# Patient Record
Sex: Male | Born: 1960 | Race: Black or African American | Hispanic: No | Marital: Married | Smoking: Current every day smoker
Health system: Southern US, Community
[De-identification: ages and names within clinical notes are randomized; demographics above are authoritative.]

## PROBLEM LIST (undated history)

## (undated) DIAGNOSIS — R002 Palpitations: Secondary | ICD-10-CM

## (undated) DIAGNOSIS — D649 Anemia, unspecified: Secondary | ICD-10-CM

## (undated) DIAGNOSIS — C61 Malignant neoplasm of prostate: Secondary | ICD-10-CM

## (undated) DIAGNOSIS — G4730 Sleep apnea, unspecified: Secondary | ICD-10-CM

## (undated) DIAGNOSIS — K219 Gastro-esophageal reflux disease without esophagitis: Secondary | ICD-10-CM

## (undated) DIAGNOSIS — I10 Essential (primary) hypertension: Secondary | ICD-10-CM

## (undated) HISTORY — DX: Malignant neoplasm of prostate: C61

## (undated) HISTORY — DX: Anemia, unspecified: D64.9

---

## 2005-02-05 ENCOUNTER — Emergency Department (HOSPITAL_COMMUNITY): Admission: EM | Admit: 2005-02-05 | Discharge: 2005-02-05 | Payer: Self-pay | Admitting: Emergency Medicine

## 2005-03-11 ENCOUNTER — Emergency Department (HOSPITAL_COMMUNITY): Admission: EM | Admit: 2005-03-11 | Discharge: 2005-03-11 | Payer: Self-pay | Admitting: Emergency Medicine

## 2006-07-20 DIAGNOSIS — R002 Palpitations: Secondary | ICD-10-CM

## 2006-07-20 HISTORY — DX: Palpitations: R00.2

## 2009-04-09 ENCOUNTER — Emergency Department (HOSPITAL_COMMUNITY): Admission: EM | Admit: 2009-04-09 | Discharge: 2009-04-09 | Payer: Self-pay | Admitting: Emergency Medicine

## 2012-01-11 ENCOUNTER — Other Ambulatory Visit: Payer: Self-pay | Admitting: Urology

## 2012-01-11 DIAGNOSIS — C61 Malignant neoplasm of prostate: Secondary | ICD-10-CM

## 2012-01-20 ENCOUNTER — Encounter (HOSPITAL_COMMUNITY)
Admission: RE | Admit: 2012-01-20 | Discharge: 2012-01-20 | Disposition: A | Discharge status: Home / Self Care | Payer: Managed Care, Other (non HMO) | Source: Ambulatory Visit | Attending: Urology | Admitting: Urology

## 2012-01-20 DIAGNOSIS — C61 Malignant neoplasm of prostate: Secondary | ICD-10-CM | POA: Insufficient documentation

## 2012-01-20 MED ORDER — TECHNETIUM TC 99M MEDRONATE IV KIT
25.0000 | PACK | Freq: Once | INTRAVENOUS | Status: AC | PRN
  Administered 2012-01-20: 25 via INTRAVENOUS

## 2012-02-22 ENCOUNTER — Other Ambulatory Visit: Payer: Self-pay | Admitting: Urology

## 2012-04-05 ENCOUNTER — Encounter (HOSPITAL_COMMUNITY): Payer: Self-pay | Admitting: Pharmacy Technician

## 2012-04-07 ENCOUNTER — Encounter (HOSPITAL_COMMUNITY): Payer: Self-pay

## 2012-04-07 ENCOUNTER — Ambulatory Visit (HOSPITAL_COMMUNITY)
Admission: RE | Admit: 2012-04-07 | Discharge: 2012-04-07 | Disposition: A | Discharge status: Home / Self Care | Payer: Managed Care, Other (non HMO) | Source: Ambulatory Visit | Attending: Urology | Admitting: Urology

## 2012-04-07 ENCOUNTER — Encounter (HOSPITAL_COMMUNITY)
Admission: RE | Admit: 2012-04-07 | Discharge: 2012-04-07 | Disposition: A | Discharge status: Home / Self Care | Payer: Managed Care, Other (non HMO) | Source: Ambulatory Visit | Attending: Urology | Admitting: Urology

## 2012-04-07 DIAGNOSIS — I10 Essential (primary) hypertension: Secondary | ICD-10-CM | POA: Insufficient documentation

## 2012-04-07 DIAGNOSIS — Z01810 Encounter for preprocedural cardiovascular examination: Secondary | ICD-10-CM | POA: Insufficient documentation

## 2012-04-07 DIAGNOSIS — C61 Malignant neoplasm of prostate: Secondary | ICD-10-CM | POA: Insufficient documentation

## 2012-04-07 DIAGNOSIS — E119 Type 2 diabetes mellitus without complications: Secondary | ICD-10-CM | POA: Insufficient documentation

## 2012-04-07 DIAGNOSIS — Z01812 Encounter for preprocedural laboratory examination: Secondary | ICD-10-CM | POA: Insufficient documentation

## 2012-04-07 HISTORY — DX: Palpitations: R00.2

## 2012-04-07 HISTORY — DX: Gastro-esophageal reflux disease without esophagitis: K21.9

## 2012-04-07 HISTORY — DX: Essential (primary) hypertension: I10

## 2012-04-07 HISTORY — DX: Sleep apnea, unspecified: G47.30

## 2012-04-07 LAB — BASIC METABOLIC PANEL
BUN: 18 mg/dL (ref 6–23)
Calcium: 9.6 mg/dL (ref 8.4–10.5)
Chloride: 102 mEq/L (ref 96–112)
Creatinine, Ser: 1.06 mg/dL (ref 0.50–1.35)
Potassium: 4 mEq/L (ref 3.5–5.1)
Sodium: 137 mEq/L (ref 135–145)

## 2012-04-07 LAB — CBC
HCT: 39.9 % (ref 39.0–52.0)
MCV: 85.4 fL (ref 78.0–100.0)

## 2012-04-07 NOTE — Patient Instructions (Signed)
18 Kevin Chung  04/07/2012   Your procedure is scheduled on:  03/2512  Wednesday  Surgery [id number]  Report to [facility] at 0600      AM.  Call this number if you have problems the morning of surgery: [phone]     Or PST   [id number]  [facility]   Remember:   Do not eat food:After Midnight.  Monday NIGHT  May have clear liquids: all day Tuesday  Until midnight then none     INCREASE FLUIDS TUESDAY  Clear liquids include soda, tea, black coffee, apple or grape juice, broth.  Take these medicines the morning of surgery with A SIP OF WATER: NONE   Do not wear jewelry, make-up or nail polish.  Do not wear lotions, powders, or perfumes. You may wear deodorant.  Do not shave 48 hours prior to surgery.  Do not bring valuables to the hospital.  Contacts, dentures or bridgework may not be worn into surgery.  Leave suitcase in the car. After surgery it may be brought to your room.  For patients admitted to the hospital, checkout time is 11:00 AM the day of discharge.   Patients discharged the day of surgery will not be allowed to drive home.  Name and phone number of your driver: [name]                                                                       Special Instructions: CHG Shower Use Special Wash:       INSTRUCTIONS GIVEN REGULAR SOAP FACE AND PRIVATES                               MEN-MAY SHAVE FACE MORNING OF SURGERY  Please read over the following fact sheets that you were given: MRSA Information

## 2012-04-07 NOTE — Pre-Procedure Instructions (Signed)
Faxed copy chest x ray to Dr Isabel Caprice with confirmation

## 2012-04-07 NOTE — Progress Notes (Signed)
04/07/12 1016  OBSTRUCTIVE SLEEP APNEA  Have you ever been diagnosed with sleep apnea through a sleep study? No  Do you snore loudly (loud enough to be heard through closed doors)?  0  Do you often feel tired, fatigued, or sleepy during the daytime? 0  Has anyone observed you stop breathing during your sleep? 1  Do you have, or are you being treated for high blood pressure? 1  BMI more than 35 kg/m2? 0  Age over 53 years old? 1  Neck circumference greater than 40 cm/18 inches? 0  Gender: 1  Obstructive Sleep Apnea Score 4   Score 4 or greater  Updated health history;Results sent to PCP

## 2012-04-13 ENCOUNTER — Inpatient Hospital Stay (HOSPITAL_COMMUNITY)
Admission: RE | Admit: 2012-04-13 | Discharge: 2012-04-14 | DRG: 708 | Disposition: A | Discharge status: Home / Self Care | Payer: Managed Care, Other (non HMO) | Source: Ambulatory Visit | Attending: Urology | Admitting: Urology

## 2012-04-13 ENCOUNTER — Encounter (HOSPITAL_COMMUNITY): Payer: Self-pay | Admitting: Registered Nurse

## 2012-04-13 ENCOUNTER — Encounter (HOSPITAL_COMMUNITY): Payer: Self-pay | Admitting: *Deleted

## 2012-04-13 ENCOUNTER — Ambulatory Visit (HOSPITAL_COMMUNITY): Payer: Managed Care, Other (non HMO) | Admitting: Registered Nurse

## 2012-04-13 ENCOUNTER — Encounter (HOSPITAL_COMMUNITY)
Admission: RE | Disposition: A | Discharge status: Home / Self Care | Payer: Self-pay | Source: Ambulatory Visit | Attending: Urology

## 2012-04-13 DIAGNOSIS — Z7982 Long term (current) use of aspirin: Secondary | ICD-10-CM

## 2012-04-13 DIAGNOSIS — K219 Gastro-esophageal reflux disease without esophagitis: Secondary | ICD-10-CM | POA: Diagnosis present

## 2012-04-13 DIAGNOSIS — G4730 Sleep apnea, unspecified: Secondary | ICD-10-CM | POA: Diagnosis present

## 2012-04-13 DIAGNOSIS — I10 Essential (primary) hypertension: Secondary | ICD-10-CM | POA: Diagnosis present

## 2012-04-13 DIAGNOSIS — C61 Malignant neoplasm of prostate: Principal | ICD-10-CM | POA: Diagnosis present

## 2012-04-13 DIAGNOSIS — F17200 Nicotine dependence, unspecified, uncomplicated: Secondary | ICD-10-CM | POA: Diagnosis present

## 2012-04-13 DIAGNOSIS — Z79899 Other long term (current) drug therapy: Secondary | ICD-10-CM

## 2012-04-13 HISTORY — PX: ROBOT ASSISTED LAPAROSCOPIC RADICAL PROSTATECTOMY: SHX5141

## 2012-04-13 LAB — ABO/RH: ABO/RH(D): O POS

## 2012-04-13 LAB — HEMOGLOBIN AND HEMATOCRIT, BLOOD: Hemoglobin: 12.4 g/dL — ABNORMAL LOW (ref 13.0–17.0)

## 2012-04-13 LAB — TYPE AND SCREEN: ABO/RH(D): O POS

## 2012-04-13 SURGERY — ROBOTIC ASSISTED LAPAROSCOPIC RADICAL PROSTATECTOMY
Anesthesia: General | Site: Abdomen | Wound class: Clean Contaminated

## 2012-04-13 MED ORDER — NEOSTIGMINE METHYLSULFATE 1 MG/ML IJ SOLN
INTRAMUSCULAR | Status: DC | PRN
  Administered 2012-04-13: 5 mg via INTRAVENOUS

## 2012-04-13 MED ORDER — SODIUM CHLORIDE 0.9 % IV SOLN
1.5000 g | INTRAVENOUS | Status: AC
  Administered 2012-04-13: 1.5 g via INTRAVENOUS

## 2012-04-13 MED ORDER — ROCURONIUM BROMIDE 100 MG/10ML IV SOLN
INTRAVENOUS | Status: DC | PRN
  Administered 2012-04-13: 80 mg via INTRAVENOUS

## 2012-04-13 MED ORDER — STERILE WATER FOR IRRIGATION IR SOLN
Status: DC | PRN
  Administered 2012-04-13: 1000 mL

## 2012-04-13 MED ORDER — MORPHINE SULFATE 10 MG/ML IJ SOLN: 2.0000 mg | INTRAMUSCULAR | Status: DC | PRN

## 2012-04-13 MED ORDER — HYDROMORPHONE HCL PF 1 MG/ML IJ SOLN
INTRAMUSCULAR | Status: DC | PRN
  Administered 2012-04-13 (×2): 0.5 mg via INTRAVENOUS

## 2012-04-13 MED ORDER — PROPOFOL 10 MG/ML IV BOLUS
INTRAVENOUS | Status: DC | PRN
  Administered 2012-04-13: 200 mg via INTRAVENOUS

## 2012-04-13 MED ORDER — HYDROMORPHONE HCL PF 1 MG/ML IJ SOLN
0.2500 mg | INTRAMUSCULAR | Status: DC | PRN
  Administered 2012-04-13 (×2): 0.25 mg via INTRAVENOUS

## 2012-04-13 MED ORDER — FENTANYL CITRATE 0.05 MG/ML IJ SOLN
INTRAMUSCULAR | Status: DC | PRN
  Administered 2012-04-13 (×5): 50 ug via INTRAVENOUS

## 2012-04-13 MED ORDER — INDIGOTINDISULFONATE SODIUM 8 MG/ML IJ SOLN
INTRAMUSCULAR | Status: AC
  Filled 2012-04-13: qty 10

## 2012-04-13 MED ORDER — HYDROCHLOROTHIAZIDE 25 MG PO TABS
25.0000 mg | ORAL_TABLET | Freq: Every morning | ORAL | Status: DC
  Administered 2012-04-13 – 2012-04-14 (×2): 25 mg via ORAL
  Filled 2012-04-13 (×2): qty 1

## 2012-04-13 MED ORDER — HYDROCODONE-ACETAMINOPHEN 5-325 MG PO TABS
1.0000 | ORAL_TABLET | ORAL | Status: DC | PRN
  Administered 2012-04-13: 1 via ORAL
  Administered 2012-04-14: 2 via ORAL
  Filled 2012-04-13: qty 1
  Filled 2012-04-13: qty 2

## 2012-04-13 MED ORDER — LACTATED RINGERS IV SOLN: INTRAVENOUS | Status: DC

## 2012-04-13 MED ORDER — HEPARIN SODIUM (PORCINE) 1000 UNIT/ML IJ SOLN
INTRAMUSCULAR | Status: AC
  Filled 2012-04-13: qty 1

## 2012-04-13 MED ORDER — MORPHINE SULFATE 2 MG/ML IJ SOLN
2.0000 mg | INTRAMUSCULAR | Status: DC | PRN
  Administered 2012-04-13 – 2012-04-14 (×2): 2 mg via INTRAVENOUS
  Filled 2012-04-13 (×2): qty 1

## 2012-04-13 MED ORDER — KCL IN DEXTROSE-NACL 10-5-0.45 MEQ/L-%-% IV SOLN
INTRAVENOUS | Status: DC
  Administered 2012-04-13: 125 mL/h via INTRAVENOUS
  Administered 2012-04-13: 22:00:00 via INTRAVENOUS
  Filled 2012-04-13 (×5): qty 1000

## 2012-04-13 MED ORDER — LACTATED RINGERS IV SOLN
INTRAVENOUS | Status: DC | PRN
  Administered 2012-04-13: 09:00:00

## 2012-04-13 MED ORDER — ACETAMINOPHEN 10 MG/ML IV SOLN
1000.0000 mg | Freq: Four times a day (QID) | INTRAVENOUS | Status: AC
  Administered 2012-04-13 (×2): 1000 mg via INTRAVENOUS
  Filled 2012-04-13 (×4): qty 100

## 2012-04-13 MED ORDER — HYDROCODONE-ACETAMINOPHEN 5-325 MG PO TABS: 1.0000 | ORAL_TABLET | Freq: Four times a day (QID) | ORAL | Status: DC | PRN

## 2012-04-13 MED ORDER — INDIGOTINDISULFONATE SODIUM 8 MG/ML IJ SOLN
INTRAMUSCULAR | Status: DC | PRN
  Administered 2012-04-13 (×2): 5 mL via INTRAVENOUS

## 2012-04-13 MED ORDER — LIDOCAINE HCL (CARDIAC) 20 MG/ML IV SOLN
INTRAVENOUS | Status: DC | PRN
  Administered 2012-04-13: 80 mg via INTRAVENOUS

## 2012-04-13 MED ORDER — ACETAMINOPHEN 10 MG/ML IV SOLN
INTRAVENOUS | Status: DC | PRN
  Administered 2012-04-13: 1000 mg via INTRAVENOUS

## 2012-04-13 MED ORDER — MEPERIDINE HCL 50 MG/ML IJ SOLN: 6.2500 mg | INTRAMUSCULAR | Status: DC | PRN

## 2012-04-13 MED ORDER — SODIUM CHLORIDE 0.9 % IR SOLN
Status: DC | PRN
  Administered 2012-04-13: 300 mL via INTRAVESICAL

## 2012-04-13 MED ORDER — PROMETHAZINE HCL 25 MG/ML IJ SOLN: 6.2500 mg | INTRAMUSCULAR | Status: DC | PRN

## 2012-04-13 MED ORDER — LACTATED RINGERS IV SOLN
INTRAVENOUS | Status: DC | PRN
  Administered 2012-04-13: 08:00:00 via INTRAVENOUS

## 2012-04-13 MED ORDER — MIDAZOLAM HCL 5 MG/5ML IJ SOLN
INTRAMUSCULAR | Status: DC | PRN
  Administered 2012-04-13: 2 mg via INTRAVENOUS

## 2012-04-13 MED ORDER — SODIUM CHLORIDE 0.9 % IV SOLN
INTRAVENOUS | Status: AC
  Filled 2012-04-13: qty 1.5

## 2012-04-13 MED ORDER — BUPIVACAINE-EPINEPHRINE 0.25% -1:200000 IJ SOLN
INTRAMUSCULAR | Status: DC | PRN
  Administered 2012-04-13: 20 mL

## 2012-04-13 MED ORDER — BUPIVACAINE-EPINEPHRINE 0.25% -1:200000 IJ SOLN
INTRAMUSCULAR | Status: AC
  Filled 2012-04-13: qty 1

## 2012-04-13 MED ORDER — ACETAMINOPHEN 10 MG/ML IV SOLN
INTRAVENOUS | Status: AC
  Filled 2012-04-13: qty 100

## 2012-04-13 MED ORDER — CIPROFLOXACIN HCL 500 MG PO TABS: 500.0000 mg | ORAL_TABLET | Freq: Two times a day (BID) | ORAL | Status: DC

## 2012-04-13 MED ORDER — GLYCOPYRROLATE 0.2 MG/ML IJ SOLN
INTRAMUSCULAR | Status: DC | PRN
  Administered 2012-04-13: .8 mg via INTRAVENOUS

## 2012-04-13 MED ORDER — SODIUM CHLORIDE 0.9 % IV BOLUS (SEPSIS)
1000.0000 mL | Freq: Once | INTRAVENOUS | Status: AC
  Administered 2012-04-13: 1000 mL via INTRAVENOUS

## 2012-04-13 MED ORDER — HYDROMORPHONE HCL PF 1 MG/ML IJ SOLN
INTRAMUSCULAR | Status: AC
  Filled 2012-04-13: qty 1

## 2012-04-13 SURGICAL SUPPLY — 46 items
CANISTER SUCTION 2500CC (MISCELLANEOUS) ×3 IMPLANT
CATH FOLEY 2WAY SLVR  5CC 18FR (CATHETERS) ×1
CATH FOLEY 2WAY SLVR  5CC 20FR (CATHETERS)
CATH FOLEY 2WAY SLVR 5CC 18FR (CATHETERS) IMPLANT
CATH FOLEY 2WAY SLVR 5CC 20FR (CATHETERS) ×2 IMPLANT
CATH ROBINSON RED A/P 8FR (CATHETERS) ×3 IMPLANT
CATH TIEMANN FOLEY 18FR 5CC (CATHETERS) ×1 IMPLANT
CHLORAPREP W/TINT 26ML (MISCELLANEOUS) ×3 IMPLANT
CLIP LIGATING HEM O LOK PURPLE (MISCELLANEOUS) ×12 IMPLANT
CLOTH BEACON ORANGE TIMEOUT ST (SAFETY) ×3 IMPLANT
CORD HIGH FREQUENCY UNIPOLAR (ELECTROSURGICAL) ×3 IMPLANT
COVER SURGICAL LIGHT HANDLE (MISCELLANEOUS) ×3 IMPLANT
COVER TIP SHEARS 8 DVNC (MISCELLANEOUS) ×2 IMPLANT
COVER TIP SHEARS 8MM DA VINCI (MISCELLANEOUS) ×1
CUTTER ECHEON FLEX ENDO 45 340 (ENDOMECHANICALS) ×3 IMPLANT
DECANTER SPIKE VIAL GLASS SM (MISCELLANEOUS) ×3 IMPLANT
DRAPE SURG IRRIG POUCH 19X23 (DRAPES) ×3 IMPLANT
DRAPE UTILITY 15X26 (DRAPE) ×2 IMPLANT
DRSG TEGADERM 2-3/8X2-3/4 SM (GAUZE/BANDAGES/DRESSINGS) ×5 IMPLANT
DRSG TEGADERM 4X4.75 (GAUZE/BANDAGES/DRESSINGS) ×1 IMPLANT
DRSG TEGADERM 6X8 (GAUZE/BANDAGES/DRESSINGS) ×8 IMPLANT
ELECT REM PT RETURN 9FT ADLT (ELECTROSURGICAL) ×3
ELECTRODE REM PT RTRN 9FT ADLT (ELECTROSURGICAL) ×2 IMPLANT
GLOVE BIO SURGEON STRL SZ 6.5 (GLOVE) ×6 IMPLANT
GLOVE BIOGEL M STRL SZ7.5 (GLOVE) ×4 IMPLANT
GOWN PREVENTION PLUS XLARGE (GOWN DISPOSABLE) ×3 IMPLANT
GOWN STRL NON-REIN LRG LVL3 (GOWN DISPOSABLE) ×3 IMPLANT
GOWN STRL REIN XL XLG (GOWN DISPOSABLE) ×4 IMPLANT
HOLDER FOLEY CATH W/STRAP (MISCELLANEOUS) ×3 IMPLANT
IV LACTATED RINGERS 1000ML (IV SOLUTION) ×3 IMPLANT
KIT ACCESSORY DA VINCI DISP (KITS) ×1
KIT ACCESSORY DVNC DISP (KITS) ×2 IMPLANT
NDL SAFETY ECLIPSE 18X1.5 (NEEDLE) ×2 IMPLANT
NEEDLE HYPO 18GX1.5 SHARP (NEEDLE) ×3
PACK ROBOT UROLOGY CUSTOM (CUSTOM PROCEDURE TRAY) ×3 IMPLANT
RELOAD GREEN ECHELON 45 (STAPLE) ×3 IMPLANT
SEALER TISSUE G2 CVD JAW 45CM (ENDOMECHANICALS) ×1 IMPLANT
SET TUBE IRRIG SUCTION NO TIP (IRRIGATION / IRRIGATOR) ×3 IMPLANT
SOLUTION ELECTROLUBE (MISCELLANEOUS) ×3 IMPLANT
SPONGE GAUZE 4X4 12PLY (GAUZE/BANDAGES/DRESSINGS) ×3 IMPLANT
SUT VIC AB 2-0 SH 27 (SUTURE) ×3
SUT VIC AB 2-0 SH 27X BRD (SUTURE) ×2 IMPLANT
SUT VICRYL 0 UR6 27IN ABS (SUTURE) ×3 IMPLANT
SYR 27GX1/2 1ML LL SAFETY (SYRINGE) ×3 IMPLANT
TOWEL OR NON WOVEN STRL DISP B (DISPOSABLE) ×3 IMPLANT
WATER STERILE IRR 1500ML POUR (IV SOLUTION) ×6 IMPLANT

## 2012-04-13 NOTE — H&P (Signed)
Urology Admission H&P  Chief Complaint: Prostate cancer for robot-assisted laparoscopic radical prostatectomy and bilateral pelvic lymph node dissection.  History of Present Illness: 51 year old African American male diagnosed by Dr. Rodman Pickle stent with adenocarcinoma the prostate. Patient had 2 positive cores on the right side revealing primarily a Gleason's for cancer. PSA was minimally elevated for his age at 3.3. Transrectal ultrasound revealed a 27 g prostate. Because a Gleason's for component the patient did have a bone scan and CT which were unremarkable for metastatic disease. The patient underwent extensive consultation with regard to treatment options has elected to proceed with the robotic-assisted laparoscopic prostatectomy.  Past Medical History  Diagnosis Date  . Hypertension   . GERD (gastroesophageal reflux disease)   . Cancer   . Sleep apnea     STOP BANG SCORE 4  . Palpitations 2008    "neg" work up per patient   Past Surgical History  Procedure Date  . No past surgeries     Home Medications:  Prescriptions prior to admission  Medication Sig Dispense Refill  . aspirin EC 81 MG tablet Take 81 mg by mouth every morning.      . hydrochlorothiazide (HYDRODIURIL) 25 MG tablet Take 25 mg by mouth every morning.      Marland Kitchen ibuprofen (ADVIL,MOTRIN) 200 MG tablet Take 400 mg by mouth every 6 (six) hours as needed. For pain or headache      . tolnaftate (TINACTIN JOCK ITCH) 1 % cream Apply 1 application topically 2 (two) times daily. Apply to rash       Allergies: No Known Allergies  History reviewed. No pertinent family history. Social History:  reports that he has been smoking Cigarettes.  He has a 25 pack-year smoking history. He has never used smokeless tobacco. He reports that he does not use illicit drugs. His alcohol history not on file.  Review of Systems  All other systems reviewed and are negative.    Physical Exam:  Vital signs in last 24 hours: Temp:  [98 F (36.7  C)] 98 F (36.7 C) (09/25 0639) Pulse Rate:  [109] 109  (09/25 0639) Resp:  [20] 20  (09/25 0639) BP: (137)/(96) 137/96 mmHg (09/25 0639) SpO2:  [100 %] 100 % (09/25 1610) Physical Exam  Constitutional: He is oriented to person, place, and time. He appears well-developed and well-nourished.  HENT:  Head: Normocephalic.  Cardiovascular: Normal rate and regular rhythm.   Respiratory: No respiratory distress.  GI: Soft. He exhibits no distension. There is no tenderness.  Genitourinary: Penis normal.  Musculoskeletal: Normal range of motion.  Neurological: He is alert and oriented to person, place, and time.  Skin: Skin is warm.  Psychiatric: He has a normal mood and affect.    Laboratory Data:  Results for orders placed during the hospital encounter of 04/13/12 (from the past 24 hour(s))  TYPE AND SCREEN     Status: Normal   Collection Time   04/13/12  7:00 AM      Component Value Range   ABO/RH(D) O POS     Antibody Screen NEG     Sample Expiration 04/16/2012    ABO/RH     Status: Normal   Collection Time   04/13/12  7:00 AM      Component Value Range   ABO/RH(D) O POS     Recent Results (from the past 240 hour(s))  SURGICAL PCR SCREEN     Status: Abnormal   Collection Time   04/07/12 11:00 AM  Component Value Range Status Comment   MRSA, PCR NEGATIVE  NEGATIVE Final    Staphylococcus aureus POSITIVE (*) NEGATIVE Final    Creatinine:  Basename 04/07/12 1100  CREATININE 1.06   Baseline Creatinine:  Impression/Assessment:  Intermediate risk clinical stage TI C. adenocarcinoma the prostate.  Plan: Robotic-assisted laparoscopic radical right fever prostatectomy with bilateral pelvic lymph node dissection. This will be admitted overnight for routine postoperative care.  Jaimee Corum S 04/13/2012, 8:00 AM

## 2012-04-13 NOTE — Care Management Note (Unsigned)
    Page 1 of 1   04/13/2012     1:53:01 PM   CARE MANAGEMENT NOTE 04/13/2012  Patient:  UJWJ,XBJYNWG   Account Number:  [id number]  Date Initiated:  04/13/2012  Documentation initiated by:  Lanier Clam  Subjective/Objective Assessment:   ADMITTED W/PROSTATE CA.     Action/Plan:   FROM HOME W/SPOUSE   Anticipated DC Date:  04/14/2012   Anticipated DC Plan:  HOME/SELF CARE      DC Planning Services  CM consult      Choice offered to / List presented to:             Status of service:  In process, will continue to follow Medicare Important Message given?   (If response is "NO", the following Medicare IM given date fields will be blank) Date Medicare IM given:   Date Additional Medicare IM given:    Discharge Disposition:    Per UR Regulation:  Reviewed for med. necessity/level of care/duration of stay  If discussed at Long Length of Stay Meetings, dates discussed:    Comments:  04/13/12 Olsen Mccutchan RN,BSN NCM [phone] S/P RADICAL PROSTATECTOMY.

## 2012-04-13 NOTE — Op Note (Signed)
Preoperative diagnosis: Clinical stage T1c Adenocarcinoma prostate  Postoperative diagnosis: Same  Procedure: Robotic-assisted laparoscopic radical retropubic prostatectomy with bilateral pelvic lymph node dissection  Surgeon: Valetta Fuller, MD  Asst.: Pecola Leisure, PA Anesthesia: Gen. Endotracheal  Indications: Patient was diagnosed with clinical stage TIc Adenocarcinoma the prostate. He underwent extensive consultation with regard to treatment options. The patient decided on a surgical approach. He appeared to understand the distinct advantages as well as the disadvantages of this procedure. The patient has performed a mechanical bowel prep. He has had placement of PAS compression boots and has received perioperative antibiotics. The patient's preoperative PSA was 3.3. Ultrasound revealed a 28 g prostate.   Technique and findings:The patient was brought to the operating room and had successful induction of general endotracheal anesthesia.the patient was placed in a low lithotomy position with careful padding of all extremities. He was secured to the operative table and placed in the steep Trendelenburg position. He was prepped and draped in usual manner. A Foley catheter was placed sterilely on the field. Camera port site was chosen 18 cm above the pubic symphysis just to the left of the umbilicus. A standard open Hassan technique was utilized. A 12 mm trocar was placed without difficulty. The camera was then inserted and no abnormalities were noted within the pelvis. The trochars were placed with direct visual guidance. This included 3 8mm robotic trochars and a 12 mm and 5 mm assist ports. Once all the ports were placed the robot was docked. The bladder was filled and the space of Retzius was developed with electrocautery dissection as well as blunt dissection. Superficial fat off the endopelvic fascia and bladder neck was removed with electrocautery scissors. The endopelvic fascia was then incised  bilaterally from base to apex. Levator musculature was swept off the apex of the prostate isolating the dorsal venous complex which was then stapled with the ETS stapling device. The anterior bladder neck was identified with the aid of the Foley balloon. This was then transected down to the Foley catheter with electrocautery scissors. The Foley catheter was then retracted anteriorly. Indigo carmine was given and we appeared to be well away from the ureteral orifices. The posterior bladder neck was then transected and the dissection carried down to the adnexal structures. The seminal vesicles and vas deferens on both sides were then individually dissected free and retracted anteriorly. The posterior plane between the rectum and prostate was then established primarily with blunt dissection.  Attention was then turned towards nerve sparing. The patient was felt to be a candidate for left-sided nerve sparing and limited right-sided nerve sparing. Superficial fascia along the anterior lateral aspect of the prostate was incised bilaterally. This tissue was then swept laterally until we were able to establish a groove between the neurovascular tissue and the posterior lateral aspect on the prostate bilaterally. This groove was then extended from the apex back to the base of the prostate. With the prostate retracted anteriorly the vascular pedicles of the prostate were taken with the Enseal device. The Foley catheter was then reinserted and the anterior urethra was transected. The posterior urethra was then transected as were some rectourethralis fibers. The prostate was then removed from the pelvis. The pelvis was then copiously irrigated. Rectal insufflation was performed and there was no evidence of rectal injury.  Attention was then turned towards bilateral pelvic lymph node dissection. The obturator node packets were removed I laterally and the dissection extended towards the bifurcation of the iliac artery. The  obturator nerve was identified on both sides and preserved. Hemalock clips were used for small veins and lymphatic channels. The node packets were sent for permanent analysis.  Attention was then turned towards reconstruction. The bladder neck did not require any reconstruction. The bladder neck and posterior urethra were reapproximated at the 6:00 position utilizing a 2-0 Vicryl suture. The rest of the anastomosis was done with a double-armed 3-0 Monocryl suture in a 360 degree manner. Additional indigo carmine was given. A new catheter was placed and bladder irrigation revealed no evidence of leakage. A Blake drain was placed through one of the robotic trochars and positioned in the retropubic space above the anastomosis. This was then secured to the skin with a nylon suture. The prostate was placed in the Endopouch retrieval bag. The 12 mm trocar site was closed with a Vicryl suture with the aid of a suture passer. Our other trochars were taken out with direct visual guidance without evidence of any bleeding. The camera port incision was extended slightly to allow for removal of the specimen and then closed with a running Vicryl suture. All port sites were infiltrated with Marcaine and then closed with surgical clips. The patient was then taken to recovery room having had no obvious complications or problems. Sponge and needle counts were correct.

## 2012-04-13 NOTE — Anesthesia Postprocedure Evaluation (Signed)
  Anesthesia Post-op Note  Patient: Kevin Chung  Procedure(s) Performed: Procedure(s) (LRB): ROBOTIC ASSISTED LAPAROSCOPIC RADICAL PROSTATECTOMY (N/A) LYMPHADENECTOMY (Bilateral)  Patient Location: PACU  Anesthesia Type: General  Level of Consciousness: awake and alert   Airway and Oxygen Therapy: Patient Spontanous Breathing  Post-op Pain: mild  Post-op Assessment: Post-op Vital signs reviewed, Patient's Cardiovascular Status Stable, Respiratory Function Stable, Patent Airway and No signs of Nausea or vomiting  Post-op Vital Signs: stable  Complications: No apparent anesthesia complications

## 2012-04-13 NOTE — Progress Notes (Signed)
Day of Surgery Subjective: Patient is very sleepy.  Denies N/V.  Pain controlled.  Objective: Vital signs in last 24 hours: Temp:  [96.4 F (35.8 C)-98 F (36.7 C)] 97 F (36.1 C) (09/25 1245) Pulse Rate:  [80-109] 87  (09/25 1307) Resp:  [13-21] 16  (09/25 1307) BP: (137-158)/(84-99) 158/99 mmHg (09/25 1307) SpO2:  [100 %] 100 % (09/25 1307)  Intake/Output from previous day:   Intake/Output this shift: Total I/O In: 2600 [I.V.:1600; IV Piggyback:1000] Out: 965 [Urine:750; Drains:90; Blood:125]  Physical Exam:  General:cooperative and no distress Cardiovascular: RRR Lungs: decreased bases GI: soft Incisions: dressings c/d/i Urine: blue Extremities: scds in place  Lab Results:  Basename 04/13/12 1203  HGB 12.4*  HCT 36.5*   BMET No results found for this basename: NA:2,K:2,CL:2,CO2:2,GLUCOSE:2,BUN:2,CREATININE:2,CALCIUM:2 in the last 72 hours No results found for this basename: LABPT:3,INR:3 in the last 72 hours No results found for this basename: LABURIN:1 in the last 72 hours Results for orders placed during the hospital encounter of 04/07/12  SURGICAL PCR SCREEN     Status: Abnormal   Collection Time   04/07/12 11:00 AM      Component Value Range Status Comment   MRSA, PCR NEGATIVE  NEGATIVE Final    Staphylococcus aureus POSITIVE (*) NEGATIVE Final     Studies/Results: No results found.  Assessment/Plan: Day of Surgery, Procedure(s) (LRB): ROBOTIC ASSISTED LAPAROSCOPIC RADICAL PROSTATECTOMY (N/A) LYMPHADENECTOMY (Bilateral)  Continue to monitor Ambulate, Incentive spirometry DVT prophylaxis   LOS: 0 days   Kevin Chung. 04/13/2012, 2:29 PM

## 2012-04-13 NOTE — Anesthesia Preprocedure Evaluation (Addendum)
Anesthesia Evaluation  Patient identified by MRN, date of birth, ID band Patient awake    Reviewed: Allergy & Precautions, H&P , NPO status , Patient's Chart, lab work & pertinent test results  Airway Mallampati: II TM Distance: >3 FB Neck ROM: Full    Dental No notable dental hx. (+) Poor Dentition and Partial Lower,    Pulmonary neg pulmonary ROS, Current Smoker,  breath sounds clear to auscultation  Pulmonary exam normal       Cardiovascular hypertension, Pt. on medications negative cardio ROS  Rhythm:Regular Rate:Normal     Neuro/Psych negative neurological ROS  negative psych ROS   GI/Hepatic negative GI ROS, Neg liver ROS,   Endo/Other  negative endocrine ROS  Renal/GU negative Renal ROS  negative genitourinary   Musculoskeletal negative musculoskeletal ROS (+)   Abdominal   Peds negative pediatric ROS (+)  Hematology negative hematology ROS (+)   Anesthesia Other Findings   Reproductive/Obstetrics negative OB ROS                          Anesthesia Physical Anesthesia Plan  ASA: II  Anesthesia Plan: General   Post-op Pain Management:    Induction: Intravenous  Airway Management Planned: Oral ETT  Additional Equipment:   Intra-op Plan:   Post-operative Plan: Extubation in OR  Informed Consent: I have reviewed the patients History and Physical, chart, labs and discussed the procedure including the risks, benefits and alternatives for the proposed anesthesia with the patient or authorized representative who has indicated his/her understanding and acceptance.   Dental advisory given  Plan Discussed with: CRNA  Anesthesia Plan Comments:         Anesthesia Quick Evaluation

## 2012-04-13 NOTE — Preoperative (Signed)
Beta Blockers   Reason not to administer Beta Blockers:Not Applicable 

## 2012-04-13 NOTE — Transfer of Care (Signed)
Immediate Anesthesia Transfer of Care Note  Patient: Kevin Chung  Procedure(s) Performed: Procedure(s) (LRB) with comments: ROBOTIC ASSISTED LAPAROSCOPIC RADICAL PROSTATECTOMY (N/A) - BILATERAL PELVIC LYMPH NODE DISSECTION  LYMPHADENECTOMY (Bilateral)  Patient Location: PACU  Anesthesia Type: General  Level of Consciousness: awake, alert , oriented and patient cooperative  Airway & Oxygen Therapy: Patient Spontanous Breathing and Patient connected to face mask oxygen  Post-op Assessment: Report given to PACU RN and Post -op Vital signs reviewed and stable  Post vital signs: Reviewed and stable  Complications: No apparent anesthesia complications

## 2012-04-14 ENCOUNTER — Encounter (HOSPITAL_COMMUNITY): Payer: Self-pay | Admitting: Urology

## 2012-04-14 LAB — BASIC METABOLIC PANEL
GFR calc Af Amer: 90 mL/min — ABNORMAL LOW (ref >90)
GFR calc non Af Amer: 77 mL/min — ABNORMAL LOW (ref >90)
Potassium: 3.6 mEq/L (ref 3.5–5.1)
Sodium: 133 mEq/L — ABNORMAL LOW (ref 135–145)

## 2012-04-14 LAB — CREATININE, FLUID (PLEURAL, PERITONEAL, JP DRAINAGE): Creat, Fluid: 1.1 mg/dL

## 2012-04-14 LAB — HEMOGLOBIN AND HEMATOCRIT, BLOOD: HCT: 33.6 % — ABNORMAL LOW (ref 39.0–52.0)

## 2012-04-14 MED ORDER — BISACODYL 10 MG RE SUPP
10.0000 mg | Freq: Once | RECTAL | Status: AC
  Administered 2012-04-14: 10 mg via RECTAL
  Filled 2012-04-14: qty 1

## 2012-04-14 NOTE — Discharge Summary (Signed)
  Date of admission: 04/13/2012  Date of discharge: 04/14/2012  Admission diagnosis: Prostate Cancer  Discharge diagnosis: Prostate Cancer  History and Physical: For full details, please see admission history and physical. Briefly, Kevin Chung is a 50 y.o. gentleman with localized prostate cancer.  After discussing management/treatment options, he elected to proceed with surgical treatment.  Hospital Course: [name] was taken to the operating room on 04/13/2012 and underwent a robotic assisted laparoscopic radical prostatectomy. He tolerated this procedure well and without complications. Postoperatively, he was able to be transferred to a regular hospital room following recovery from anesthesia.  He was able to begin ambulating the night of surgery. He remained hemodynamically stable overnight.  He had excellent urine output with appropriately minimal output from his pelvic drain and his pelvic drain was removed on POD #1.  He was transitioned to oral pain medication, tolerated a clear liquid diet, and had met all discharge criteria and was able to be discharged home later on POD#1.  Laboratory values:  Basename 04/14/12 0430 04/13/12 1203  HGB 11.4* 12.4*  HCT 33.6* 36.5*    Disposition: Home  Discharge instruction: He was instructed to be ambulatory but to refrain from heavy lifting, strenuous activity, or driving. He was instructed on urethral catheter care.  Discharge medications:     Medication List     As of 04/14/2012  2:13 PM    START taking these medications         ciprofloxacin 500 MG tablet   Commonly known as: CIPRO   Take 1 tablet (500 mg total) by mouth 2 (two) times daily. Start day prior to office visit for foley removal      HYDROcodone-acetaminophen 5-325 MG per tablet   Commonly known as: NORCO/VICODIN   Take 1-2 tablets by mouth every 6 (six) hours as needed for pain.      CONTINUE taking these medications         hydrochlorothiazide 25 MG tablet   Commonly known as: HYDRODIURIL      STOP taking these medications         aspirin EC 81 MG tablet      ibuprofen 200 MG tablet   Commonly known as: ADVIL,MOTRIN      TINACTIN JOCK ITCH 1 % cream   Generic drug: tolnaftate          Where to get your medications    These are the prescriptions that you need to pick up.   You may get these medications from any pharmacy.         ciprofloxacin 500 MG tablet   HYDROcodone-acetaminophen 5-325 MG per tablet            Followup: He will followup in 1 week for catheter removal and to discuss his surgical pathology results.

## 2012-04-14 NOTE — Progress Notes (Signed)
1 Day Post-Op Subjective: Patient reports pain control good.  Denies N/V.  Ambulating  Objective: Vital signs in last 24 hours: Temp:  [96.4 F (35.8 C)-98.9 F (37.2 C)] 98.4 F (36.9 C) (09/26 0605) Pulse Rate:  [71-93] 71  (09/26 0605) Resp:  [13-21] 18  (09/26 0605) BP: (96-158)/(56-99) 111/64 mmHg (09/26 0605) SpO2:  [99 %-100 %] 99 % (09/26 0605) Weight:  [68 kg (149 lb 14.6 oz)] 68 kg (149 lb 14.6 oz) (09/25 1438)  Intake/Output from previous day: 09/25 0701 - 09/26 0700 In: 4506.7 [P.O.:240; I.V.:3166.7; IV Piggyback:1100] Out: 3585 [Urine:3250; Drains:210; Blood:125] Intake/Output this shift:    Physical Exam:  General:alert, cooperative and no distress Cardiovascular: RRR Lungs: decreased BS bases GI: soft, appropriately tender, faint bowel sounds, no palpable masses Incisions: dressings c/d/i Urine: clear/yellow Extremities: warm with SCDs  Lab Results:  Basename 04/14/12 0430 04/13/12 1203  HGB 11.4* 12.4*  HCT 33.6* 36.5*   BMET  Basename 04/14/12 0430  NA 133*  K 3.6  CL 100  CO2 26  GLUCOSE 108*  BUN 7  CREATININE 1.07  CALCIUM 8.6   No results found for this basename: LABPT:3,INR:3 in the last 72 hours No results found for this basename: LABURIN:1 in the last 72 hours Results for orders placed during the hospital encounter of 04/07/12  SURGICAL PCR SCREEN     Status: Abnormal   Collection Time   04/07/12 11:00 AM      Component Value Range Status Comment   MRSA, PCR NEGATIVE  NEGATIVE Final    Staphylococcus aureus POSITIVE (*) NEGATIVE Final     Studies/Results: No results found.  Assessment/Plan: 1 Day Post-Op, Procedure(s) (LRB): ROBOTIC ASSISTED LAPAROSCOPIC RADICAL PROSTATECTOMY (N/A) LYMPHADENECTOMY (Bilateral)  Ambulate, Incentive spirometry DVT prophylaxis Transition to PO pain medications Check drain creatinine level SL IVF Dulcolax supp Poss d/c later today  LOS: 1 day   YARBROUGH,Artice Bergerson G. 04/14/2012, 7:23  AM

## 2013-09-16 ENCOUNTER — Other Ambulatory Visit: Payer: Self-pay

## 2013-09-16 ENCOUNTER — Encounter (HOSPITAL_COMMUNITY): Payer: Self-pay | Admitting: Emergency Medicine

## 2013-09-16 ENCOUNTER — Observation Stay (HOSPITAL_COMMUNITY)
Admission: EM | Admit: 2013-09-16 | Discharge: 2013-09-17 | Disposition: A | Discharge status: Home / Self Care | Payer: Managed Care, Other (non HMO) | Attending: Emergency Medicine | Admitting: Emergency Medicine

## 2013-09-16 ENCOUNTER — Emergency Department (HOSPITAL_COMMUNITY): Payer: Managed Care, Other (non HMO)

## 2013-09-16 DIAGNOSIS — Z7982 Long term (current) use of aspirin: Secondary | ICD-10-CM | POA: Insufficient documentation

## 2013-09-16 DIAGNOSIS — Z8546 Personal history of malignant neoplasm of prostate: Secondary | ICD-10-CM | POA: Insufficient documentation

## 2013-09-16 DIAGNOSIS — Z23 Encounter for immunization: Secondary | ICD-10-CM | POA: Insufficient documentation

## 2013-09-16 DIAGNOSIS — Z720 Tobacco use: Secondary | ICD-10-CM

## 2013-09-16 DIAGNOSIS — I10 Essential (primary) hypertension: Secondary | ICD-10-CM

## 2013-09-16 DIAGNOSIS — F17200 Nicotine dependence, unspecified, uncomplicated: Secondary | ICD-10-CM | POA: Insufficient documentation

## 2013-09-16 DIAGNOSIS — K219 Gastro-esophageal reflux disease without esophagitis: Secondary | ICD-10-CM | POA: Insufficient documentation

## 2013-09-16 DIAGNOSIS — Z79899 Other long term (current) drug therapy: Secondary | ICD-10-CM | POA: Insufficient documentation

## 2013-09-16 DIAGNOSIS — R0789 Other chest pain: Principal | ICD-10-CM | POA: Insufficient documentation

## 2013-09-16 DIAGNOSIS — Z8669 Personal history of other diseases of the nervous system and sense organs: Secondary | ICD-10-CM | POA: Insufficient documentation

## 2013-09-16 DIAGNOSIS — R079 Chest pain, unspecified: Secondary | ICD-10-CM | POA: Diagnosis present

## 2013-09-16 LAB — BASIC METABOLIC PANEL
BUN: 18 mg/dL (ref 6–23)
CALCIUM: 9.3 mg/dL (ref 8.4–10.5)
CO2: 27 meq/L (ref 19–32)
CREATININE: 1.21 mg/dL (ref 0.50–1.35)
Chloride: 100 mEq/L (ref 96–112)
GFR calc Af Amer: 77 mL/min — ABNORMAL LOW (ref >90)
GFR calc non Af Amer: 66 mL/min — ABNORMAL LOW (ref >90)
GLUCOSE: 105 mg/dL — AB (ref 70–99)
Potassium: 3.7 mEq/L (ref 3.7–5.3)
Sodium: 140 mEq/L (ref 137–147)

## 2013-09-16 LAB — CBC
HCT: 37.9 % — ABNORMAL LOW (ref 39.0–52.0)
HEMOGLOBIN: 13.2 g/dL (ref 13.0–17.0)
MCH: 29.5 pg (ref 26.0–34.0)
MCHC: 34.8 g/dL (ref 30.0–36.0)
MCV: 84.8 fL (ref 78.0–100.0)
Platelets: 145 10*3/uL — ABNORMAL LOW (ref 150–400)
RBC: 4.47 MIL/uL (ref 4.22–5.81)
RDW: 12.6 % (ref 11.5–15.5)
WBC: 5.8 10*3/uL (ref 4.0–10.5)

## 2013-09-16 LAB — I-STAT TROPONIN, ED: TROPONIN I, POC: 0 ng/mL (ref 0.00–0.08)

## 2013-09-16 LAB — TROPONIN I
Troponin I: <0.3 ng/mL (ref <0.30)
Troponin I: <0.3 ng/mL (ref <0.30)

## 2013-09-16 LAB — PRO B NATRIURETIC PEPTIDE: Pro B Natriuretic peptide (BNP): 43.5 pg/mL (ref 0–125)

## 2013-09-16 MED ORDER — ENOXAPARIN SODIUM 40 MG/0.4ML ~~LOC~~ SOLN
40.0000 mg | SUBCUTANEOUS | Status: DC
  Administered 2013-09-16: 40 mg via SUBCUTANEOUS
  Filled 2013-09-16 (×2): qty 0.4

## 2013-09-16 MED ORDER — ONDANSETRON HCL 4 MG/2ML IJ SOLN: 4.0000 mg | Freq: Four times a day (QID) | INTRAMUSCULAR | Status: DC | PRN

## 2013-09-16 MED ORDER — ASPIRIN 81 MG PO CHEW
324.0000 mg | CHEWABLE_TABLET | Freq: Once | ORAL | Status: AC
  Administered 2013-09-16: 324 mg via ORAL
  Filled 2013-09-16: qty 4

## 2013-09-16 MED ORDER — ACETAMINOPHEN 325 MG PO TABS
650.0000 mg | ORAL_TABLET | ORAL | Status: DC | PRN
  Administered 2013-09-16: 650 mg via ORAL
  Filled 2013-09-16: qty 2

## 2013-09-16 MED ORDER — PNEUMOCOCCAL VAC POLYVALENT 25 MCG/0.5ML IJ INJ
0.5000 mL | INJECTION | INTRAMUSCULAR | Status: AC
Start: 2013-09-17 — End: 2013-09-17
  Administered 2013-09-17: 0.5 mL via INTRAMUSCULAR
  Filled 2013-09-16: qty 0.5

## 2013-09-16 MED ORDER — EXERCISE FOR HEART AND HEALTH BOOK
Freq: Once | Status: AC
Start: 2013-09-16 — End: 2013-09-16
  Administered 2013-09-16: 17:00:00
  Filled 2013-09-16: qty 1

## 2013-09-16 MED ORDER — NITROGLYCERIN 2 % TD OINT
1.0000 [in_us] | TOPICAL_OINTMENT | Freq: Once | TRANSDERMAL | Status: AC
  Administered 2013-09-16: 1 [in_us] via TOPICAL
  Filled 2013-09-16: qty 1

## 2013-09-16 MED ORDER — ACTIVE PARTNERSHIP FOR HEALTH OF YOUR HEART BOOK
Freq: Once | Status: AC
Start: 2013-09-16 — End: 2013-09-16
  Administered 2013-09-16: 17:00:00
  Filled 2013-09-16: qty 1

## 2013-09-16 MED ORDER — TRAMADOL HCL 50 MG PO TABS
50.0000 mg | ORAL_TABLET | Freq: Four times a day (QID) | ORAL | Status: DC | PRN
  Filled 2013-09-16: qty 1

## 2013-09-16 NOTE — H&P (Signed)
History and Physical   Patient ID: Kevin Chung MRN: [id number], DOB/AGE: 1960-10-10 53 y.o. Date of Encounter: 09/16/2013  Primary Physician: [facility] [place] Primary Cardiologist: New  Chief Complaint:  Chest pain  HPI: Kevin Chung is a 53 y.o. male with no history of CAD. CRFs are HTN, tobacco use, and hyperlipidemia.  He has recently been in his usual state of health except for right arm pain times one week. He describes it as an aching and burning and it worsens with exertion. He has not tried medications specifically for this.  He works nights and when he woke yesterday afternoon he noticed pain in his chest. It was on the left side and worse with movement and activity. He denies a change with deep inspiration and he denies chest wall tenderness. He has no upper respiratory symptoms. He went to work last p.m. but the pain worsened when he was lifting and moving boxes. He states the pain reached a 9/10. It was all the way across his chest at its worst.   He left work at 2 a.m. and came to the ER. In the emergency room, he has received ASA 81 mg x 4 and nitroglycerin ointment 1 inch. The pain has resolved. When asked if he was having pain, the pain sat up in bed, moved his arms and twisted around but stated he did not have any discomfort at all except for headache, likely from the nitroglycerin. He is very concerned about his right arm pain. The right arm pain does not worsen with pushing or pulling of his arm or any particular movement. The arm feels weak to him but no decrease in strength was noted on exam. He has no point tenderness and says he feels the pain is on the inside of his arm.  Past Medical History  Diagnosis Date  . Hypertension   . GERD (gastroesophageal reflux disease)   . Cancer     Prostate  . Sleep apnea     STOP BANG SCORE 4  . Palpitations 2008    "neg" work up per patient    Surgical History:  Past Surgical History  Procedure  Laterality Date  . No past surgeries    . Robot assisted laparoscopic radical prostatectomy  04/13/2012    Procedure: ROBOTIC ASSISTED LAPAROSCOPIC RADICAL PROSTATECTOMY;  Surgeon: Bernestine Amass, MD;  Location: [facility];  Service: Urology;  Laterality: N/A;  BILATERAL PELVIC LYMPH NODE DISSECTION      I have reviewed the patient's current medications. Prior to Admission medications   Medication Sig Start Date End Date  aspirin EC 81 MG tablet Take 81 mg by mouth daily.    hydrochlorothiazide (HYDRODIURIL) 25 MG tablet Take 25 mg by mouth every morning.    traMADol (ULTRAM) 50 MG tablet Take 50 mg by mouth daily.  09/01/13    Allergies: No Known Allergies  History   Social History  . Marital Status: Married    Spouse Name: N/A    Number of Children: N/A  . Years of Education: N/A   Occupational History  . [employer]   Social History Main Topics  . Smoking status: Current Every Day Smoker -- 1.00 packs/day for 25 years    Types: Cigarettes  . Smokeless tobacco: Never Used  . Alcohol Use: Yes     Comment: rarely  . Drug Use: No  . Sexual Activity: Not on file   Other Topics Concern  . Not on file  Social History Narrative   Lives alone, currently separated. Not aware of any CAD in siblings. Original leaflet from [place], he has been in the [place] over 20 years.    Family Status  Relation Status Death Age  . Mother Deceased 24    No hx CAD  . Father Deceased 27s    No hx CAD    Review of Systems:   Full 14-point review of systems otherwise negative except as noted above.  Physical Exam: Blood pressure 133/91, pulse 71, temperature 98.4 F (36.9 C), temperature source Oral, resp. rate 15, height 6' (1.829 m), weight 154 lb (69.854 kg), SpO2 99.00%. General: Well developed, well nourished,male in no acute distress. Head: Normocephalic, atraumatic, sclera non-icteric, no xanthomas, nares are without discharge. Dentition: Good Neck: No carotid bruits. JVD not  elevated. No thyromegally Lungs: Good expansion bilaterally. without wheezes or rhonchi.  Heart: Regular rate and rhythm with S1 S2.  No S3 or S4.  No murmur, no rubs, or gallops appreciated. Abdomen: Soft, non-tender, non-distended with normoactive bowel sounds. No hepatomegaly. No rebound/guarding. No obvious abdominal masses. Msk:  Strength and tone appear normal for age. No joint deformities or effusions, no spine or costo-vertebral angle tenderness. Extremities: No clubbing or cyanosis. No edema.  Distal pedal pulses are 2+ in 4 extrem Neuro: Alert and oriented X 3. Moves all extremities spontaneously. No focal deficits noted. Psych:  Responds to questions appropriately with a normal affect. Skin: No rashes or lesions noted  Labs:   Lab Results  Component Value Date   WBC 5.8 09/16/2013   HGB 13.2 09/16/2013   HCT 37.9* 09/16/2013   MCV 84.8 09/16/2013   PLT 145* 09/16/2013     Recent Labs Lab 09/16/13 0305  NA 140  K 3.7  CL 100  CO2 27  BUN 18  CREATININE 1.21  CALCIUM 9.3  GLUCOSE 105*    Recent Labs  09/16/13 0655  TROPONINI <0.30    Recent Labs  09/16/13 0310  TROPIPOC 0.00   Pro B Natriuretic peptide (BNP)  Date/Time Value Ref Range Status  09/16/2013  3:05 AM 43.5  0 - 125 pg/mL Final    Radiology/Studies: Dg Chest 2 View 09/16/2013   CLINICAL DATA:  Chest pain  EXAM: CHEST  2 VIEW  COMPARISON:  Prior radiograph from 04/07/2012  FINDINGS: The cardiac and mediastinal silhouettes are stable in size and contour, and remain within normal limits.  The lungs are normally inflated. No airspace consolidation, pleural effusion, or pulmonary edema is identified. There is no pneumothorax.  No acute osseous abnormality identified.  IMPRESSION: No active cardiopulmonary disease.   Electronically Signed   By: Jeannine Boga M.D.   On: 09/16/2013 03:50   ECG: SR, severe LVH  ASSESSMENT AND PLAN:  Active Problems:  Chest pain - initial ez negative but pt with  multiple cardiac risk factors. Admit to observation, continue to cycle enzymes. If enzymes remain negative, stress echo in a.m. He has severe LVH on his ECG so we'll check a regular echocardiogram as well. Screen for cardiac risk factors and encourage tobacco cessation.  Jonetta Speak, PA-C 09/16/2013 11:26 AM Beeper [phone]  53 yo male with multiple CAD risk factors admitted with chest pain. By history symptoms are mixed for possible cardiac etiology. EKG shows LVH, troponins negative thus far. Will admit overnight for observation, plan for stress echo in AM. 2D echo for LVH.  Carlyle Dolly MD

## 2013-09-16 NOTE — ED Notes (Signed)
Pt reports he has had right arm pain x 1 week, increased pain with physical activity.  Left/mid chest pain since yesterday; + tenderness upon palpation.  Hurts worse when working.

## 2013-09-16 NOTE — ED Notes (Signed)
MD at Bedside.

## 2013-09-16 NOTE — ED Provider Notes (Signed)
CSN: [id number]     Arrival date & time 09/16/13  0254 History   First MD Initiated Contact with Patient 09/16/13 [phone]     Chief Complaint  Patient presents with  . Chest Pain     (Consider location/radiation/quality/duration/timing/severity/associated sxs/prior Treatment) Patient is a 53 y.o. male presenting with chest pain.  Chest Pain  53 yo male presents with chest pain that started yesterday around 5pm. Patient states he woke up with the chest pain that is dull/achy in nature rated currently at a 8/10. Pain is constant though waxes and wanes.  Pain is exacerbated with activity, lifting, moving positions in the bed from side to side. Patient denies SOB, fever/chills, orthopnea, HA, dizziness, N/V, diaphoresis. Patient states he took tramadol with little relief. Patient admits to associated RIGHT arm pain that is worse with lifting. Patient works at [employer] and states he does a lot of lifting.   Advanced age > 49 yo: Yes HTN: Yes Hyperlipidemia: Yes Cigarette smoking: Yes Diabetes Mellitus: No Family hx of CAD or MI < 16 yo : No Male or Post menopausal: Yes Cocaine use: No Prior MI: No CABG: No Stress test: No      Past Medical History  Diagnosis Date  . Hypertension   . GERD (gastroesophageal reflux disease)   . Cancer     Prostate  . Sleep apnea     STOP BANG SCORE 4  . Palpitations 2008    "neg" work up per patient   Past Surgical History  Procedure Laterality Date  . No past surgeries    . Robot assisted laparoscopic radical prostatectomy  04/13/2012    Procedure: ROBOTIC ASSISTED LAPAROSCOPIC RADICAL PROSTATECTOMY;  Surgeon: Bernestine Amass, MD;  Location: [facility];  Service: Urology;  Laterality: N/A;  BILATERAL PELVIC LYMPH NODE DISSECTION    No family history on file. History  Substance Use Topics  . Smoking status: Current Every Day Smoker -- 1.00 packs/day for 25 years    Types: Cigarettes  . Smokeless tobacco: Never Used  . Alcohol Use: Yes   Comment: rarely    Review of Systems  Cardiovascular: Positive for chest pain.  All other systems reviewed and are negative.      Allergies  Review of patient's allergies indicates no known allergies.  Home Medications   Current Outpatient Rx  Name  Route  Sig  Dispense  Refill  . aspirin EC 81 MG tablet   Oral   Take 81 mg by mouth daily.         . hydrochlorothiazide (HYDRODIURIL) 25 MG tablet   Oral   Take 25 mg by mouth every morning.         . traMADol (ULTRAM) 50 MG tablet   Oral   Take 50 mg by mouth daily.           BP 115/85  Pulse 71  Temp(Src) 98.4 F (36.9 C) (Oral)  Resp 17  Ht 6' (1.829 m)  Wt 154 lb (69.854 kg)  BMI 20.88 kg/m2  SpO2 100% Physical Exam  Nursing note and vitals reviewed. Constitutional: He is oriented to person, place, and time. He appears well-developed and well-nourished. No distress.  HENT:  Head: Normocephalic and atraumatic.  Eyes: Conjunctivae are normal. No scleral icterus.  Neck: No JVD present. Carotid bruit is not present. No tracheal deviation present.  Cardiovascular: Normal rate and regular rhythm.  Exam reveals no gallop and no friction rub.   No murmur heard. Pulmonary/Chest: Effort normal  and breath sounds normal. No respiratory distress. He has no wheezes. He has no rhonchi. He has no rales.  Abdominal: Soft. Bowel sounds are normal. He exhibits no distension. There is no hepatosplenomegaly. There is no tenderness. There is no rigidity, no rebound, no guarding, no tenderness at McBurney's point and negative Murphy's sign.  Musculoskeletal: Normal range of motion. He exhibits no edema.  RIGHT arm pain illicited with Resisted flexion of RIGHT elbow.     Neurological: He is alert and oriented to person, place, and time.  Skin: Skin is warm and dry. He is not diaphoretic.  Psychiatric: He has a normal mood and affect. His behavior is normal.    ED Course  Procedures (including critical care time) Labs  Review Labs Reviewed  CBC - Abnormal; Notable for the following:    HCT 37.9 (*)    Platelets 145 (*)    All other components within normal limits  BASIC METABOLIC PANEL - Abnormal; Notable for the following:    Glucose, Bld 105 (*)    GFR calc non Af Amer 66 (*)    GFR calc Af Amer 77 (*)    All other components within normal limits  PRO B NATRIURETIC PEPTIDE  TROPONIN I  I-STAT TROPOININ, ED   Imaging Review Dg Chest 2 View  09/16/2013   CLINICAL DATA:  Chest pain  EXAM: CHEST  2 VIEW  COMPARISON:  Prior radiograph from 04/07/2012  FINDINGS: The cardiac and mediastinal silhouettes are stable in size and contour, and remain within normal limits.  The lungs are normally inflated. No airspace consolidation, pleural effusion, or pulmonary edema is identified. There is no pneumothorax.  No acute osseous abnormality identified.  IMPRESSION: No active cardiopulmonary disease.   Electronically Signed   By: Jeannine Boga M.D.   On: 09/16/2013 03:50     EKG Interpretation None      MDM   Final diagnoses:  Chest pain   Delta troponin negative BNP negative CXR negative Patient appears low risk with Heart Score of 3. Patient does have many cardiac risk factors.   Patient discussed with Dr. Jola Schmidt. Cardiology consulted. Plan to admit patient for observation overnight with stress test in the morning.    Meds given in ED:  Medications  aspirin chewable tablet 324 mg (324 mg Oral Given 09/16/13 0733)  nitroGLYCERIN (NITROGLYN) 2 % ointment 1 inch (1 inch Topical Given 09/16/13 0734)    New Prescriptions   No medications on file           Sherrie George, PA-C 09/17/13 0448

## 2013-09-16 NOTE — ED Notes (Signed)
Reports R arm pain x 4-5 days.  C/o sharp pain to L side of chest since yesterday.  Denies sob, nausea, and vomiting.

## 2013-09-17 DIAGNOSIS — R072 Precordial pain: Secondary | ICD-10-CM

## 2013-09-17 LAB — LIPID PANEL
CHOL/HDL RATIO: 3.1 ratio
Cholesterol: 149 mg/dL (ref 0–200)
HDL: 48 mg/dL (ref >39)
LDL CALC: 88 mg/dL (ref 0–99)
Triglycerides: 64 mg/dL (ref <150)
VLDL: 13 mg/dL (ref 0–40)

## 2013-09-17 LAB — TROPONIN I: Troponin I: <0.3 ng/mL (ref <0.30)

## 2013-09-17 MED ORDER — ASPIRIN EC 81 MG PO TBEC
81.0000 mg | DELAYED_RELEASE_TABLET | Freq: Every day | ORAL | Status: DC
  Administered 2013-09-17: 81 mg via ORAL
  Filled 2013-09-17: qty 1

## 2013-09-17 NOTE — Discharge Instructions (Signed)
Kevin Chung was hospitalized from Sat Sep 16, 2013 to Sun September 17, 2013. He will have a stress test in our office this week (3/2-3/6). Our office will arrange and call him with appointment. He will remain out of work this week until after his stress test is done. He may return to work if the stress test is normal.

## 2013-09-17 NOTE — Progress Notes (Addendum)
Patient ID: Kevin Chung, male   DOB: 06/21/1961, 53 y.o.   MRN: [id number]      Subjective:   Chronic right arm pain this mornign worst with movement, no chest pain.    Objective:   Temp:  [98 F (36.7 C)-98.8 F (37.1 C)] 98.5 F (36.9 C) (03/01 0400) Pulse Rate:  [51-83] 51 (03/01 0400) Resp:  [14-18] 18 (03/01 0400) BP: (109-119)/(66-86) 109/73 mmHg (03/01 0400) SpO2:  [99 %-100 %] 99 % (03/01 0400) Weight:  [148 lb 13.1 oz (67.503 kg)-149 lb (67.586 kg)] 148 lb 13.1 oz (67.503 kg) (03/01 0400) Last BM Date: 09/17/13  Filed Weights   09/16/13 0304 09/16/13 1317 09/17/13 0400  Weight: 154 lb (69.854 kg) 149 lb (67.586 kg) 148 lb 13.1 oz (67.503 kg)    Intake/Output Summary (Last 24 hours) at 09/17/13 0924 Last data filed at 09/16/13 1800  Gross per 24 hour  Intake    240 ml  Output      0 ml  Net    240 ml    Telemetry: NSR  Exam:  General: NAD  Resp: CTAB  Cardiac: RRR, no m/r/g, no JVD, no carotid bruits  GI: abdomen soft, NT, ND  MSK: LE warm, no edema  Neuro: no focal deficits   Lab Results:  Basic Metabolic Panel:  Recent Labs Lab 09/16/13 0305  NA 140  K 3.7  CL 100  CO2 27  GLUCOSE 105*  BUN 18  CREATININE 1.21  CALCIUM 9.3    Liver Function Tests: No results found for this basename: AST, ALT, ALKPHOS, BILITOT, PROT, ALBUMIN,  in the last 168 hours  CBC:  Recent Labs Lab 09/16/13 0305  WBC 5.8  HGB 13.2  HCT 37.9*  MCV 84.8  PLT 145*    Cardiac Enzymes:  Recent Labs Lab 09/16/13 1250 09/16/13 1810 09/17/13 0145  TROPONINI <0.30 <0.30 <0.30    BNP:  Recent Labs  09/16/13 0305  PROBNP 43.5    Coagulation: No results found for this basename: INR,  in the last 168 hours  ECG:   Medications:   Scheduled Medications: . enoxaparin (LOVENOX) injection  40 mg Subcutaneous Q24H  . pneumococcal 23 valent vaccine  0.5 mL Intramuscular Tomorrow-1000     Infusions:     PRN Medications:  acetaminophen,  ondansetron (ZOFRAN) IV, traMADol     Assessment/Plan    1. Chest pain - no evidence of ACS, trop neg x 3, ekg without ischemic changes - has some right arm pain this morning that is constant, worst with movement. No chest pain.  - echo pending, if normal will discharge home today with outpatient stress test this week.     Carlyle Dolly, M.D., F.A.C.C.  Addendum 09/17/13 250 pm Echo reviewed, normal LVEF with no wall motion abnormalities. Patient with no recurrent chest pain, EKG and cardiac enzymes negative for ACS. Fairly atypical symptoms on presentation. Will discharge home today with outpatient stress echo for this week, new patient cardiology appt within 2-3 weeks.  Carlyle Dolly MD

## 2013-09-17 NOTE — Discharge Summary (Signed)
CARDIOLOGY DISCHARGE SUMMARY    Patient ID: Kevin Chung,  MRN: [id number], DOB/AGE: [birth year] 53 y.o.  Admit date: 09/16/2013 Discharge date: 09/17/2013  Primary Care Physician: No PCP Per Patient Primary Cardiologist: New to [facility] - seen by Dr. Harl Bowie but will need follow-up in [place]  Primary Discharge Diagnosis:  1. Chest pain - ECG non-acute, serial troponin negative, normal LVEF and normal wall motion by echo this admission - stable for outpt stress echo this week  Secondary Discharge Diagnoses:  1. HTN 2. Dyslipidemia 3. Tobacco abuse  Procedures This Admission:  1. 2D echocardiogram Study Conclusions - Left ventricle: The cavity size was normal. Wall thickness was normal. Systolic function was normal. The estimated ejection fraction was in the range of 55% to 60%. Wall motion was normal; there were no regional wall motion abnormalities. Left ventricular diastolic function parameters were normal. - Aortic valve: Valve area: 2.43cm^2(VTI). Valve area: 2.8cm^2 (Vmax).  History and Hospital Course:  Kevin Chung is a 53 year old man with no history of CAD. CRFs are HTN, tobacco use, and hyperlipidemia. He presented yesterday with chest pain and right arm pain described as an aching and burning, worse with exertion, ongoing for one week. He went to work the night prior to admission and the pain worsened when he was lifting and moving boxes. He states the pain reached a 9/10 and radiated across his entire chest, prompting him to come into ED. In the emergency room, he received ASA 81 mg x 4 and nitroglycerin ointment 1 inch. The pain resolved. He was observed overnight. No evidence of ACS. Serial troponin negative. Echo revealed normal LVEF, normal wall motion and no significant valvular abnormalities. He has been seen, examined and deemed stable for discharge today by Dr. Carlyle Dolly. He will undergo a treadmill stress echo for cardiac risk stratification this week  (3/2-3/6) in our office. He may return to work after his stress test if it is normal.  Discharge Vitals: Blood pressure 126/87, pulse 78, temperature 97.7 F (36.5 C), temperature source Oral, resp. rate 20, height 6' (1.829 m), weight 148 lb 13.1 oz (67.503 kg), SpO2 99.00%.   Labs: Lab Results  Component Value Date   WBC 5.8 09/16/2013   HGB 13.2 09/16/2013   HCT 37.9* 09/16/2013   MCV 84.8 09/16/2013   PLT 145* 09/16/2013     Recent Labs Lab 09/16/13 0305  NA 140  K 3.7  CL 100  CO2 27  BUN 18  CREATININE 1.21  CALCIUM 9.3  GLUCOSE 105*   Lab Results  Component Value Date   TROPONINI <0.30 09/17/2013    Lab Results  Component Value Date   CHOL 149 09/17/2013   Lab Results  Component Value Date   HDL 48 09/17/2013   Lab Results  Component Value Date   LDLCALC 88 09/17/2013   Lab Results  Component Value Date   TRIG 64 09/17/2013   Lab Results  Component Value Date   CHOLHDL 3.1 09/17/2013    Disposition:  The patient is being discharged in stable condition.  Follow-up:     Follow-up Information   Follow up with [facility] In 1 week. (For stress test; Our office will call you on Monday March 2 to schedule)    Specialty:  Cardiology   Contact information:   [address] [place] [address] [phone]     Discharge Medications:    Medication List  aspirin EC 81 MG tablet  Take 81 mg by mouth daily.     hydrochlorothiazide 25 MG tablet  Commonly known as:  HYDRODIURIL  Take 25 mg by mouth every morning.     traMADol 50 MG tablet  Commonly known as:  ULTRAM  Take 50 mg by mouth daily.       Duration of Discharge Encounter: Greater than 30 minutes including physician time.  Manson Passey, PA-C 09/17/2013, 4:37 PM

## 2013-09-17 NOTE — Care Management Utilization Note (Signed)
UR complete    Dolan Xia,MSN,RN [phone] 

## 2013-09-17 NOTE — ED Provider Notes (Signed)
Medical screening examination/treatment/procedure(s) were conducted as a shared visit with non-physician practitioner(s) and myself.  I personally evaluated the patient during the encounter.   EKG Interpretation None      Patient with history of chronic tobacco use presents with left-sided chest pain radiating to left arm for 4-5 days. Denies any shortness of breath nausea or vomiting. Pain is not reproducible exam. EKG without acute findings troponin normal. Discuss with cardiology recommended rule out admission by hospitalist  Julianne Rice, MD 09/17/13 [phone]

## 2013-09-22 ENCOUNTER — Encounter: Payer: Self-pay | Admitting: Cardiovascular Disease

## 2013-09-25 ENCOUNTER — Encounter: Payer: Self-pay | Admitting: *Deleted

## 2013-09-25 ENCOUNTER — Ambulatory Visit (INDEPENDENT_AMBULATORY_CARE_PROVIDER_SITE_OTHER): Payer: Managed Care, Other (non HMO) | Admitting: Cardiovascular Disease

## 2013-09-25 VITALS — BP 144/96 | HR 103 | Ht 72.0 in | Wt 154.1 lb

## 2013-09-25 DIAGNOSIS — R079 Chest pain, unspecified: Secondary | ICD-10-CM

## 2013-09-25 DIAGNOSIS — R9431 Abnormal electrocardiogram [ECG] [EKG]: Secondary | ICD-10-CM

## 2013-09-25 MED ORDER — LOSARTAN POTASSIUM-HCTZ 50-12.5 MG PO TABS: 1.0000 | ORAL_TABLET | Freq: Every day | ORAL | Status: DC

## 2013-09-25 NOTE — Assessment & Plan Note (Signed)
Stop diuretic and add Hyzaar 50/12.5  F/U with me next available to check BP and BMET

## 2013-09-25 NOTE — Assessment & Plan Note (Signed)
Requiring ER visit and short hospital stay.  R/O CXR with normal mediastinum  Echo with no RWMA;s  ECG abnormal with LVH Likely from HTN  Continued pain post d/c  But atypical and improved.  Prefer cardiac CT to r/o CAD  Otherwise will need stress myovue ECG has LVH so cannot have regular ETT

## 2013-09-25 NOTE — Progress Notes (Signed)
Patient ID: Kevin Chung, male   DOB: 1961-04-24, 53 y.o.   MRN: [id number] Kevin Chung is a 53 y.o. male with no history of CAD. CRFs are HTN, tobacco use, and hyperlipidemia.  He has recently been in his usual state of health except for right arm pain times one week. He describes it as an aching and burning and it worsens with exertion. He has not tried medications specifically for this.  He works nights and when he woke yesterday afternoon he noticed pain in his chest. It was on the left side and worse with movement and activity. He denies a change with deep inspiration and he denies chest wall tenderness. He has no upper respiratory symptoms. He went to work last p.m. but the pain worsened when he was lifting and moving boxes. He states the pain reached a 9/10. It was all the way across his chest at its worst.  He left work at 2 a.m. and came to the ER. In the emergency room, he has received ASA 81 mg x 4 and nitroglycerin ointment 1 inch. The pain has resolved. When asked if he was having pain, the pain sat up in bed, moved his arms and twisted around but stated he did not have any discomfort at all except for headache, likely from the nitroglycerin. He is very concerned about his right arm pain. The right arm pain does not worsen with pushing or pulling of his arm or any particular movement. The arm feels weak to him but no decrease in strength was noted on exam. He has no point tenderness and says he feels the pain is on the inside of his arm.  In hospital Echo normal no HOCM Stress test needed to be arranged as outpatient Interestingly despite ECG showing LVH None on echo Wants to go back to work at [employer].    Since d/c has had 2 short isolated episodes of sharp left sided chest pain lasting less than a minute    ROS: Denies fever, malais, weight loss, blurry vision, decreased visual acuity, cough, sputum, SOB, hemoptysis, pleuritic pain, palpitaitons, heartburn, abdominal pain, melena,  lower extremity edema, claudication, or rash.  All other systems reviewed and negative  General: Affect appropriate Healthy:  appears stated age 24: normal Neck supple with no adenopathy JVP normal no bruits no thyromegaly Lungs clear with no wheezing and good diaphragmatic motion Heart:  S1/S2 no murmur, no rub, gallop or click PMI normal Abdomen: benighn, BS positve, no tenderness, no AAA no bruit.  No HSM or HJR Distal pulses intact with no bruits No edema Neuro non-focal Skin warm and dry No muscular weakness   Current Outpatient Prescriptions  Medication Sig Dispense Refill  . aspirin EC 81 MG tablet Take 81 mg by mouth daily.      . hydrochlorothiazide (HYDRODIURIL) 25 MG tablet Take 25 mg by mouth every morning.      . traMADol (ULTRAM) 50 MG tablet Take 50 mg by mouth as needed.        No current facility-administered medications for this visit.    Allergies  Review of patient's allergies indicates no known allergies.  Electrocardiogram:  SR with LVH    Assessment and Plan

## 2013-09-25 NOTE — Patient Instructions (Addendum)
Your physician recommends that you schedule a follow-up appointment in: NEXT AVAILABLE WITH  DR [facility]   Your physician has recommended you make the following change in your medication:  STOP  HYDROCHLOROTHIAZIDE START   LOSARTAN/HCTZ  50 /12.5 MG Your physician has requested that you have cardiac CT. Cardiac computed tomography (CT) is a painless test that uses an x-ray machine to take clear, detailed pictures of your heart. For further information please visit [email or web address]. Please follow instruction sheet as given.

## 2013-10-12 ENCOUNTER — Telehealth: Payer: Self-pay | Admitting: *Deleted

## 2013-10-12 ENCOUNTER — Other Ambulatory Visit: Payer: Self-pay | Admitting: *Deleted

## 2013-10-12 DIAGNOSIS — R079 Chest pain, unspecified: Secondary | ICD-10-CM

## 2013-10-12 NOTE — Telephone Encounter (Signed)
PT  AWARE OF  TEST CHANGE AND  INSTRUCTIONS  GIVEN  FOR  MYOVIEW .CY

## 2013-10-12 NOTE — Telephone Encounter (Signed)
Message copied by Richmond Campbell on Thu Oct 12, 2013  1:48 PM ------      Message from: Mack Guise B      Created: Mon Oct 09, 2013  4:15 PM      Regarding: RE: Cardiac CTA Denied       Florentine Diekman need order for [facility] - have you spoke with about test change.?      ----- Message -----         From: Lillia Pauls         Sent: 10/06/2013   9:22 AM           To: Richmond Campbell, LPN, Armando Gang      Subject: FW: Cardiac CTA Denied                                   Ivin Booty,            Please schedule pt for myoview.            Aleathia Purdy,            Please canx order for cardiac CTA and put in order for myoview.            Thanks,            Charmaine      ----- Message -----         From: [name], MD         Sent: 10/05/2013   8:53 PM           To: Lillia Pauls      Subject: RE: Cardiac CTA Denied                                   Exercise stress myovue            ----- Message -----         From: Lillia Pauls         Sent: 10/05/2013  10:39 AM           To: [name], MD      Subject: Cardiac CTA Denied                                       Dr. Hayden Pedro.  Needs recent stress test (first).  Please advise.            Thanks            Charmaine                   ------

## 2013-10-23 ENCOUNTER — Encounter: Payer: Self-pay | Admitting: Cardiovascular Disease

## 2013-10-23 ENCOUNTER — Ambulatory Visit (INDEPENDENT_AMBULATORY_CARE_PROVIDER_SITE_OTHER): Payer: Managed Care, Other (non HMO) | Admitting: Cardiovascular Disease

## 2013-10-23 VITALS — BP 141/100 | HR 92 | Ht 72.0 in | Wt 156.8 lb

## 2013-10-23 DIAGNOSIS — Z79899 Other long term (current) drug therapy: Secondary | ICD-10-CM

## 2013-10-23 LAB — BASIC METABOLIC PANEL
BUN: 12 mg/dL (ref 6–23)
CALCIUM: 9.5 mg/dL (ref 8.4–10.5)
CO2: 28 mEq/L (ref 19–32)
CREATININE: 1 mg/dL (ref 0.4–1.5)
Chloride: 100 mEq/L (ref 96–112)
GFR: 102.27 mL/min (ref >60.00)
Glucose, Bld: 72 mg/dL (ref 70–99)
Potassium: 3.7 mEq/L (ref 3.5–5.1)
Sodium: 136 mEq/L (ref 135–145)

## 2013-10-23 MED ORDER — LOSARTAN POTASSIUM-HCTZ 100-25 MG PO TABS: 1.0000 | ORAL_TABLET | Freq: Every day | ORAL | Status: DC

## 2013-10-23 NOTE — Assessment & Plan Note (Signed)
Atypical  CT denied by insurance  LVH on ECG  F/u  Stress myovue

## 2013-10-23 NOTE — Progress Notes (Signed)
Patient ID: Kevin Chung, male   DOB: [birth year], 53 y.o.   MRN: [id number] Kevin Chung is a 53 y.o. male with no history of CAD. CRFs are HTN, tobacco use, and hyperlipidemia.  He has recently been in his usual state of health except for right arm pain times one week. He describes it as an aching and burning and it worsens with exertion. He has not tried medications specifically for this.  He works nights and when he woke yesterday afternoon he noticed pain in his chest. It was on the left side and worse with movement and activity. He denies a change with deep inspiration and he denies chest wall tenderness. He has no upper respiratory symptoms. He went to work last p.m. but the pain worsened when he was lifting and moving boxes. He states the pain reached a 9/10. It was all the way across his chest at its worst.  He left work at 2 a.m. and came to the ER. In the emergency room, he has received ASA 81 mg x 4 and nitroglycerin ointment 1 inch. The pain has resolved. When asked if he was having pain, the pain sat up in bed, moved his arms and twisted around but stated he did not have any discomfort at all except for headache, likely from the nitroglycerin. He is very concerned about his right arm pain. The right arm pain does not worsen with pushing or pulling of his arm or any particular movement. The arm feels weak to him but no decrease in strength was noted on exam. He has no point tenderness and says he feels the pain is on the inside of his arm.  In hospital Echo normal no HOCM Stress test needed to be arranged as outpatient Interestingly despite ECG showing LVH  None on echo Wants to go back to work at [employer].  Since d/c has had 2 short isolated episodes of sharp left sided chest pain lasting less than a minute  Last visit ARB added for BP  Wanted to do cardiac CT but insurance wouldn't allow and waiting on myovue still     ROS: Denies fever, malais, weight loss, blurry vision, decreased  visual acuity, cough, sputum, SOB, hemoptysis, pleuritic pain, palpitaitons, heartburn, abdominal pain, melena, lower extremity edema, claudication, or rash.  All other systems reviewed and negative  General: Affect appropriate Healthy:  appears stated age 52: normal Neck supple with no adenopathy JVP normal no bruits no thyromegaly Lungs clear with no wheezing and good diaphragmatic motion Heart:  S1/S2 no murmur, no rub, gallop or click PMI normal Abdomen: benighn, BS positve, no tenderness, no AAA no bruit.  No HSM or HJR Distal pulses intact with no bruits No edema Neuro non-focal Skin warm and dry No muscular weakness   Current Outpatient Prescriptions  Medication Sig Dispense Refill  . aspirin EC 81 MG tablet Take 81 mg by mouth daily.      Marland Kitchen losartan-hydrochlorothiazide (HYZAAR) 50-12.5 MG per tablet Take 1 tablet by mouth daily.  30 tablet  11   No current facility-administered medications for this visit.    Allergies  Review of patient's allergies indicates no known allergies.  Electrocardiogram:  SR rate 67 LVH   Assessment and Plan

## 2013-10-23 NOTE — Assessment & Plan Note (Signed)
Smoking cessation instruction/counseling given:  counseled patient on the dangers of tobacco use, advised patient to stop smoking, and reviewed strategies to maximize success 

## 2013-10-23 NOTE — Assessment & Plan Note (Signed)
Increase hyzaar to 100/25  BMET today f/u 3 months

## 2013-10-23 NOTE — Patient Instructions (Signed)
Your physician wants you to follow-up in:   Anna will receive a reminder letter in the mail two months in advance. If you don't receive a letter, please call our office to schedule the follow-up appointment. Your physician has recommended you make the following change in your medication:  INCREASE  LOSARTAN/HCTZ  TO  100/25 MG   Your physician recommends that you return for lab work in: [place]

## 2013-10-26 ENCOUNTER — Ambulatory Visit (HOSPITAL_COMMUNITY): Payer: Managed Care, Other (non HMO) | Attending: Cardiology | Admitting: Radiology

## 2013-10-26 VITALS — BP 130/86 | Ht 72.0 in | Wt 154.0 lb

## 2013-10-26 DIAGNOSIS — R5383 Other fatigue: Secondary | ICD-10-CM

## 2013-10-26 DIAGNOSIS — R079 Chest pain, unspecified: Secondary | ICD-10-CM

## 2013-10-26 DIAGNOSIS — R5381 Other malaise: Secondary | ICD-10-CM | POA: Insufficient documentation

## 2013-10-26 DIAGNOSIS — I10 Essential (primary) hypertension: Secondary | ICD-10-CM | POA: Insufficient documentation

## 2013-10-26 MED ORDER — TECHNETIUM TC 99M SESTAMIBI GENERIC - CARDIOLITE
33.0000 | Freq: Once | INTRAVENOUS | Status: AC | PRN
  Administered 2013-10-26: 33 via INTRAVENOUS

## 2013-10-26 MED ORDER — TECHNETIUM TC 99M SESTAMIBI GENERIC - CARDIOLITE
10.8000 | Freq: Once | INTRAVENOUS | Status: AC | PRN
  Administered 2013-10-26: 11 via INTRAVENOUS

## 2013-10-26 NOTE — Progress Notes (Signed)
[address] [place], [place] [address] [phone]    Cardiology Nuclear Med Study  Kevin Chung is a 53 y.o. male     MRN : [id number]     DOB: 1960-08-05  Procedure Date: 10/26/2013  Nuclear Med Background Indication for Stress Test:  Evaluation for Ischemia and [name] 3/15 Chest Pain History:  2015 Echo EF 55-60% Cardiac Risk Factors: Hypertension and Lipids  Symptoms:  Chest Pain   Nuclear Pre-Procedure Caffeine/Decaff Intake:  None NPO After: 3:00am   Lungs:  clear O2 Sat: 97% on room air. IV 0.9% NS with Angio Cath:  22g  IV Site: R Hand  IV Started by:  Crissie Figures, RN  Chest Size (in):  40 Cup Size: n/a  Height: 6' (1.829 m)  Weight:  154 lb (69.854 kg)  BMI:  Body mass index is 20.88 kg/(m^2). Tech Comments:  N/A    Nuclear Med Study 1 or 2 day study: 1 day  Stress Test Type:  Stress  Reading MD: N/A  Order Authorizing Provider:  Jenkins Rouge, MD  Resting Radionuclide: Technetium 53m Sestamibi  Resting Radionuclide Dose: 11.0 mCi   Stress Radionuclide:  Technetium 67m Sestamibi  Stress Radionuclide Dose: 33.0 mCi           Stress Protocol Rest HR: 71 Stress HR: 166  Rest BP: 130/86 Stress BP: 186/99  Exercise Time (min): 8:01 METS: 10.1   Predicted Max HR: 166 bpm % Max HR: 100 bpm Rate Pressure Product: 30876   Dose of Adenosine (mg):  n/a Dose of Lexiscan: n/a mg  Dose of Atropine (mg): n/a Dose of Dobutamine: n/a mcg/kg/min (at max HR)  Stress Test Technologist: Crissie Figures, RN  Nuclear Technologist:  Annye Rusk, CNMT     Rest Procedure:  Myocardial perfusion imaging was performed at rest 45 minutes following the intravenous administration of Technetium 70m Sestamibi. Rest ECG: NSR - Normal EKG  Stress Procedure:  The patient exercised on the treadmill utilizing the Bruce Protocol for 8:01 minutes. The patient stopped due to fatigue and leg pain and denied any chest pain.  Technetium 55m Sestamibi  was injected at peak exercise and myocardial perfusion imaging was performed after a brief delay. Stress ECG: No significant change from baseline ECG  QPS Raw Data Images:  Normal; no motion artifact; normal heart/lung ratio. Stress Images:  Normal homogeneous uptake in all areas of the myocardium. Rest Images:  Normal homogeneous uptake in all areas of the myocardium. Subtraction (SDS):  Normal Transient Ischemic Dilatation (Normal <1.22):  0.97 Lung/Heart Ratio (Normal <0.45):  0.24  Quantitative Gated Spect Images QGS EDV:  96 ml QGS ESV:  50 ml  Impression Exercise Capacity:  Fair exercise capacity. BP Response:  Normal blood pressure response. Clinical Symptoms:  Fatigue ECG Impression:  No significant ST segment change suggestive of ischemia. Comparison with Prior Nuclear Study: No images to compare  Overall Impression:  Normal stress nuclear study.  LV Ejection Fraction: 49%.  LV Wall Motion:  Normal Wall Motion  EF quantitates mildly low but looks normal and is normal by recent echo    [name]

## 2014-04-15 IMAGING — CR DG CHEST 2V
2 series · 2 of 2 positions shown · non-contrast
Comparison: Prior radiograph from 04/07/2012

CLINICAL DATA: Chest pain

EXAM:
CHEST  2 VIEW

[w chest pa]
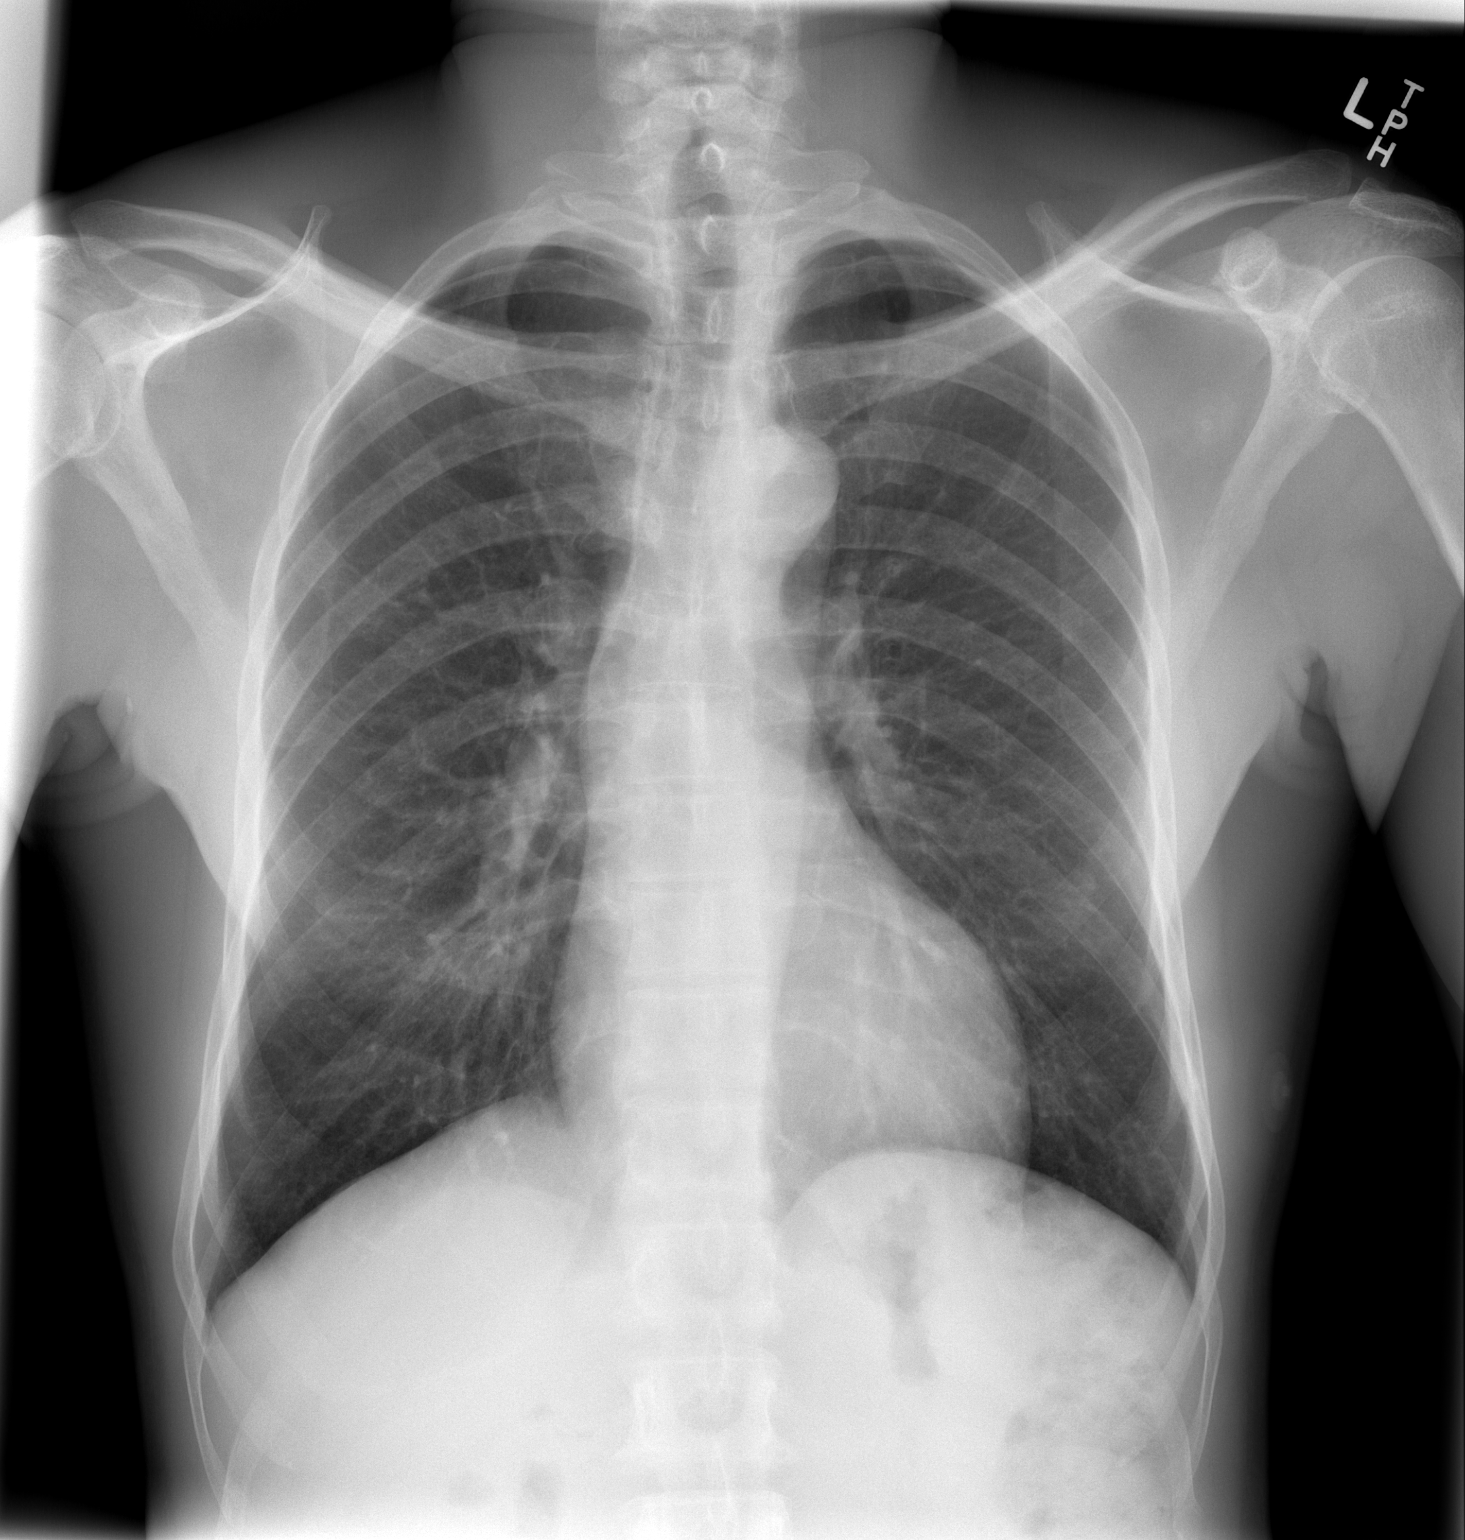

[w chest lat]
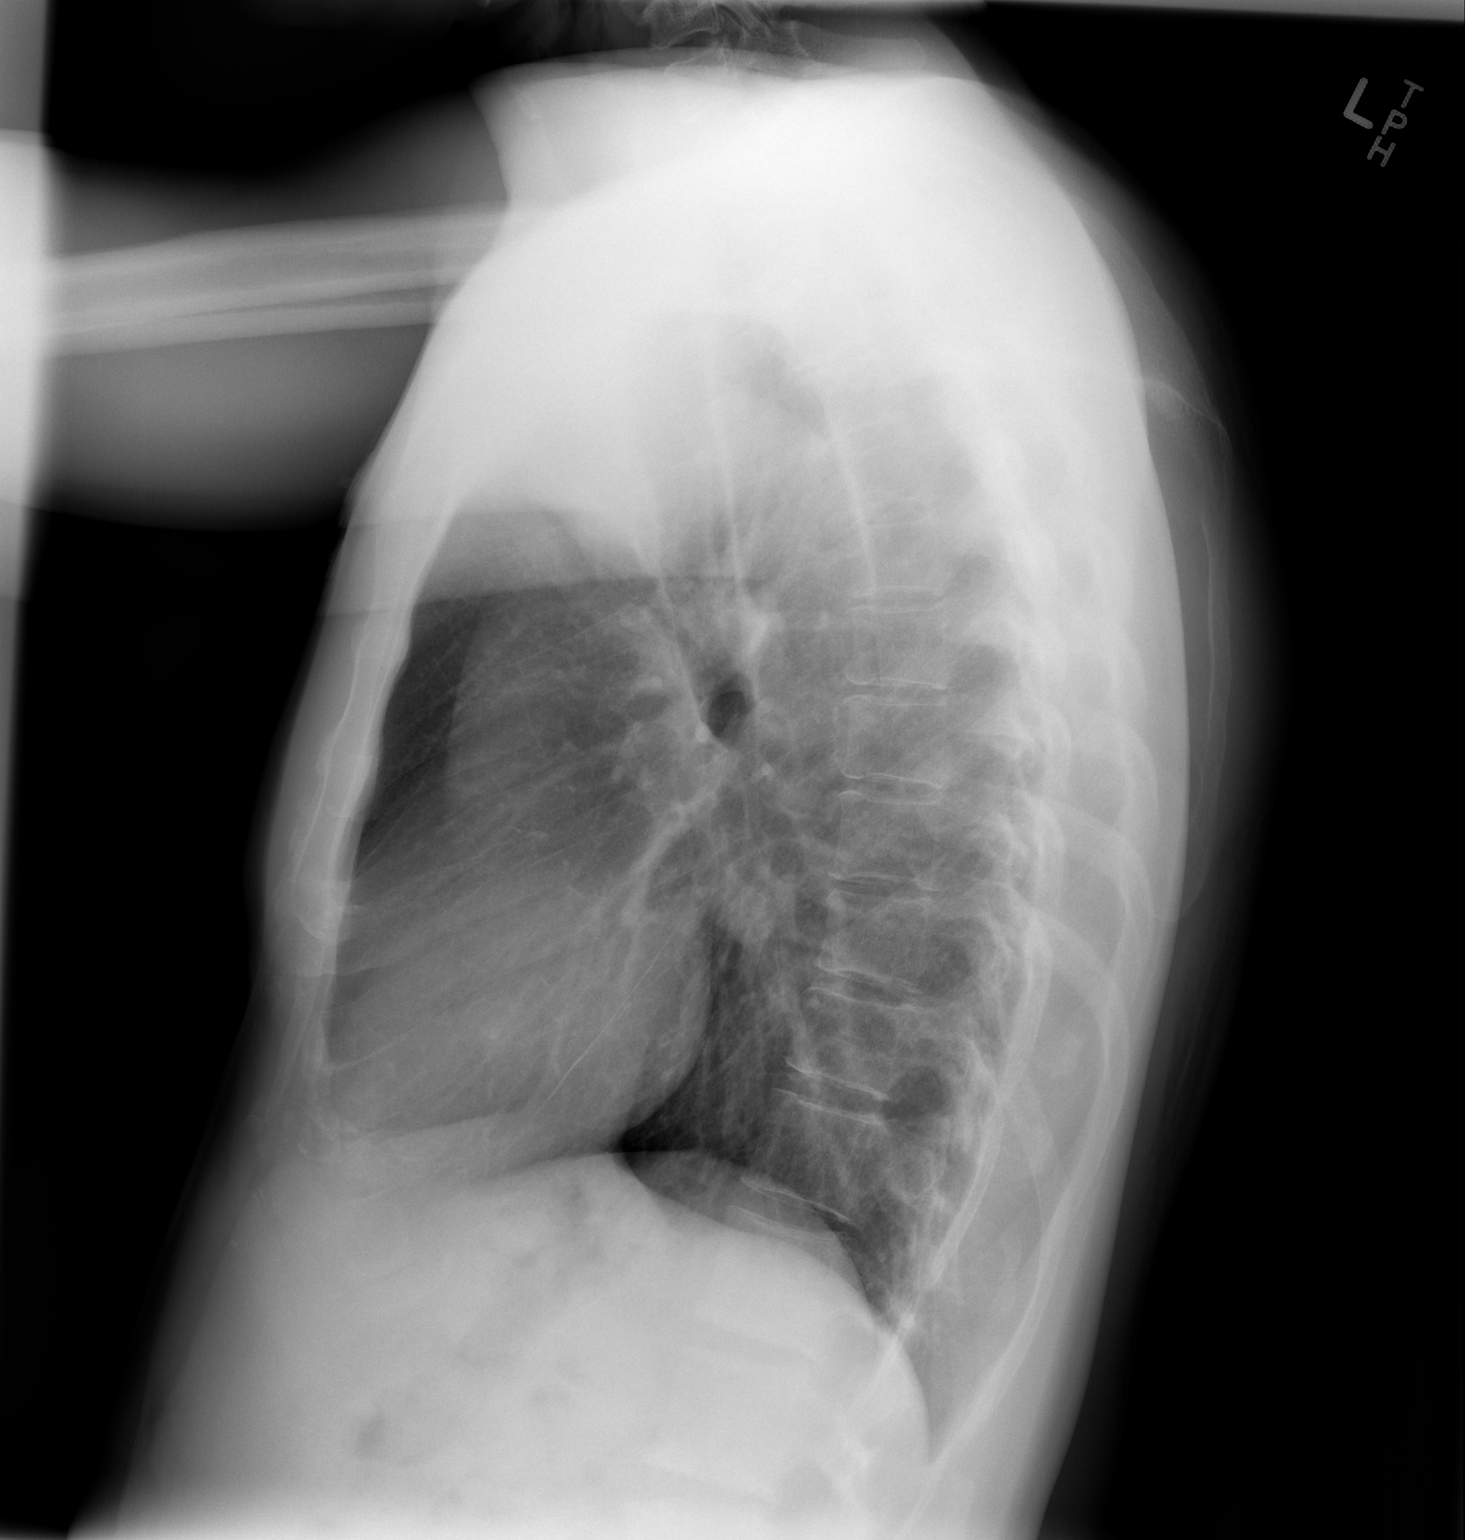

[2 of 2 positions shown; findings below may reference images not displayed]

FINDINGS: The cardiac and mediastinal silhouettes are stable in size and
contour, and remain within normal limits.

The lungs are normally inflated. No airspace consolidation, pleural
effusion, or pulmonary edema is identified. There is no
pneumothorax.

No acute osseous abnormality identified.
IMPRESSION: No active cardiopulmonary disease.

## 2014-08-13 ENCOUNTER — Encounter (HOSPITAL_COMMUNITY): Payer: Self-pay | Admitting: Emergency Medicine

## 2014-08-13 DIAGNOSIS — G4730 Sleep apnea, unspecified: Secondary | ICD-10-CM | POA: Diagnosis present

## 2014-08-13 DIAGNOSIS — K260 Acute duodenal ulcer with hemorrhage: Principal | ICD-10-CM | POA: Diagnosis present

## 2014-08-13 DIAGNOSIS — T39395A Adverse effect of other nonsteroidal anti-inflammatory drugs [NSAID], initial encounter: Secondary | ICD-10-CM | POA: Diagnosis present

## 2014-08-13 DIAGNOSIS — Z8546 Personal history of malignant neoplasm of prostate: Secondary | ICD-10-CM

## 2014-08-13 DIAGNOSIS — K219 Gastro-esophageal reflux disease without esophagitis: Secondary | ICD-10-CM | POA: Diagnosis present

## 2014-08-13 DIAGNOSIS — R42 Dizziness and giddiness: Secondary | ICD-10-CM | POA: Diagnosis not present

## 2014-08-13 DIAGNOSIS — Z7982 Long term (current) use of aspirin: Secondary | ICD-10-CM

## 2014-08-13 DIAGNOSIS — F17210 Nicotine dependence, cigarettes, uncomplicated: Secondary | ICD-10-CM | POA: Diagnosis present

## 2014-08-13 DIAGNOSIS — I10 Essential (primary) hypertension: Secondary | ICD-10-CM | POA: Diagnosis present

## 2014-08-13 DIAGNOSIS — D62 Acute posthemorrhagic anemia: Secondary | ICD-10-CM | POA: Diagnosis present

## 2014-08-13 NOTE — ED Notes (Signed)
Pt. reports black stools onset last Friday with fatigue/generalized weakness, dizziness and headache , seen at [facility] walk in clinic today - cbc result shows low HGB .

## 2014-08-14 ENCOUNTER — Encounter (HOSPITAL_COMMUNITY)
Admission: EM | Disposition: A | Discharge status: Home / Self Care | Payer: Self-pay | Source: Home / Self Care | Attending: Internal Medicine

## 2014-08-14 ENCOUNTER — Encounter (HOSPITAL_COMMUNITY): Payer: Self-pay | Admitting: *Deleted

## 2014-08-14 ENCOUNTER — Inpatient Hospital Stay (HOSPITAL_COMMUNITY)
Admission: EM | Admit: 2014-08-14 | Discharge: 2014-08-15 | DRG: 378 | Disposition: A | Discharge status: Home / Self Care | Payer: Managed Care, Other (non HMO) | Attending: Internal Medicine | Admitting: Internal Medicine

## 2014-08-14 DIAGNOSIS — D62 Acute posthemorrhagic anemia: Secondary | ICD-10-CM

## 2014-08-14 DIAGNOSIS — Z7982 Long term (current) use of aspirin: Secondary | ICD-10-CM | POA: Diagnosis not present

## 2014-08-14 DIAGNOSIS — Z720 Tobacco use: Secondary | ICD-10-CM | POA: Diagnosis present

## 2014-08-14 DIAGNOSIS — I10 Essential (primary) hypertension: Secondary | ICD-10-CM | POA: Diagnosis present

## 2014-08-14 DIAGNOSIS — K921 Melena: Secondary | ICD-10-CM | POA: Diagnosis present

## 2014-08-14 DIAGNOSIS — K922 Gastrointestinal hemorrhage, unspecified: Secondary | ICD-10-CM

## 2014-08-14 DIAGNOSIS — R42 Dizziness and giddiness: Secondary | ICD-10-CM | POA: Diagnosis present

## 2014-08-14 DIAGNOSIS — K260 Acute duodenal ulcer with hemorrhage: Secondary | ICD-10-CM | POA: Diagnosis present

## 2014-08-14 DIAGNOSIS — T39395A Adverse effect of other nonsteroidal anti-inflammatory drugs [NSAID], initial encounter: Secondary | ICD-10-CM | POA: Diagnosis present

## 2014-08-14 DIAGNOSIS — K219 Gastro-esophageal reflux disease without esophagitis: Secondary | ICD-10-CM | POA: Diagnosis present

## 2014-08-14 DIAGNOSIS — F17210 Nicotine dependence, cigarettes, uncomplicated: Secondary | ICD-10-CM | POA: Diagnosis present

## 2014-08-14 DIAGNOSIS — Z8546 Personal history of malignant neoplasm of prostate: Secondary | ICD-10-CM | POA: Diagnosis not present

## 2014-08-14 DIAGNOSIS — G4730 Sleep apnea, unspecified: Secondary | ICD-10-CM | POA: Diagnosis present

## 2014-08-14 HISTORY — PX: ESOPHAGOGASTRODUODENOSCOPY: SHX5428

## 2014-08-14 LAB — CBC
HEMATOCRIT: 23.4 % — AB (ref 39.0–52.0)
HEMOGLOBIN: 7.8 g/dL — AB (ref 13.0–17.0)
MCH: 28.5 pg (ref 26.0–34.0)
MCHC: 33.3 g/dL (ref 30.0–36.0)
MCV: 85.4 fL (ref 78.0–100.0)
Platelets: 130 10*3/uL — ABNORMAL LOW (ref 150–400)
RBC: 2.74 MIL/uL — ABNORMAL LOW (ref 4.22–5.81)
RDW: 14.2 % (ref 11.5–15.5)
WBC: 5.2 10*3/uL (ref 4.0–10.5)

## 2014-08-14 LAB — COMPREHENSIVE METABOLIC PANEL
ALBUMIN: 2.9 g/dL — AB (ref 3.5–5.2)
ALBUMIN: 3.5 g/dL (ref 3.5–5.2)
ALK PHOS: 60 U/L (ref 39–117)
ALT: 17 U/L (ref 0–53)
ALT: 20 U/L (ref 0–53)
ANION GAP: 5 (ref 5–15)
AST: 24 U/L (ref 0–37)
AST: 25 U/L (ref 0–37)
Alkaline Phosphatase: 51 U/L (ref 39–117)
Anion gap: 6 (ref 5–15)
BILIRUBIN TOTAL: 0.5 mg/dL (ref 0.3–1.2)
BILIRUBIN TOTAL: 0.8 mg/dL (ref 0.3–1.2)
BUN: 15 mg/dL (ref 6–23)
BUN: 17 mg/dL (ref 6–23)
CALCIUM: 9 mg/dL (ref 8.4–10.5)
CHLORIDE: 102 mmol/L (ref 96–112)
CO2: 27 mmol/L (ref 19–32)
CO2: 30 mmol/L (ref 19–32)
CREATININE: 1.06 mg/dL (ref 0.50–1.35)
Calcium: 8.8 mg/dL (ref 8.4–10.5)
Chloride: 107 mmol/L (ref 96–112)
Creatinine, Ser: 1.12 mg/dL (ref 0.50–1.35)
GFR calc Af Amer: 84 mL/min — ABNORMAL LOW (ref >90)
GFR calc Af Amer: 89 mL/min — ABNORMAL LOW (ref >90)
GFR calc non Af Amer: 72 mL/min — ABNORMAL LOW (ref >90)
GFR calc non Af Amer: 77 mL/min — ABNORMAL LOW (ref >90)
GLUCOSE: 88 mg/dL (ref 70–99)
Glucose, Bld: 102 mg/dL — ABNORMAL HIGH (ref 70–99)
Potassium: 3.4 mmol/L — ABNORMAL LOW (ref 3.5–5.1)
Potassium: 3.9 mmol/L (ref 3.5–5.1)
SODIUM: 139 mmol/L (ref 135–145)
Sodium: 138 mmol/L (ref 135–145)
Total Protein: 5.1 g/dL — ABNORMAL LOW (ref 6.0–8.3)
Total Protein: 6.1 g/dL (ref 6.0–8.3)

## 2014-08-14 LAB — CBC WITH DIFFERENTIAL/PLATELET
BASOS ABS: 0 10*3/uL (ref 0.0–0.1)
Basophils Relative: 0 % (ref 0–1)
EOS PCT: 3 % (ref 0–5)
Eosinophils Absolute: 0.2 10*3/uL (ref 0.0–0.7)
HEMATOCRIT: 23 % — AB (ref 39.0–52.0)
Hemoglobin: 7.7 g/dL — ABNORMAL LOW (ref 13.0–17.0)
LYMPHS PCT: 31 % (ref 12–46)
Lymphs Abs: 2.1 10*3/uL (ref 0.7–4.0)
MCH: 28.9 pg (ref 26.0–34.0)
MCHC: 33.5 g/dL (ref 30.0–36.0)
MCV: 86.5 fL (ref 78.0–100.0)
MONOS PCT: 5 % (ref 3–12)
Monocytes Absolute: 0.3 10*3/uL (ref 0.1–1.0)
Neutro Abs: 4.1 10*3/uL (ref 1.7–7.7)
Neutrophils Relative %: 61 % (ref 43–77)
Platelets: 153 10*3/uL (ref 150–400)
RBC: 2.66 MIL/uL — ABNORMAL LOW (ref 4.22–5.81)
RDW: 13.5 % (ref 11.5–15.5)
WBC: 6.8 10*3/uL (ref 4.0–10.5)

## 2014-08-14 LAB — PROTIME-INR
INR: 1.03 (ref 0.00–1.49)
Prothrombin Time: 13.6 seconds (ref 11.6–15.2)

## 2014-08-14 LAB — OCCULT BLOOD, POC DEVICE: Fecal Occult Bld: POSITIVE — AB

## 2014-08-14 LAB — SAMPLE TO BLOOD BANK

## 2014-08-14 LAB — ABO/RH: ABO/RH(D): O POS

## 2014-08-14 LAB — APTT: aPTT: 26 seconds (ref 24–37)

## 2014-08-14 LAB — PREPARE RBC (CROSSMATCH)

## 2014-08-14 SURGERY — EGD (ESOPHAGOGASTRODUODENOSCOPY)
Anesthesia: Moderate Sedation

## 2014-08-14 MED ORDER — SODIUM CHLORIDE 0.9 % IV SOLN: Freq: Once | INTRAVENOUS | Status: DC

## 2014-08-14 MED ORDER — FENTANYL CITRATE 0.05 MG/ML IJ SOLN
INTRAMUSCULAR | Status: DC | PRN
  Administered 2014-08-14 (×2): 25 ug via INTRAVENOUS

## 2014-08-14 MED ORDER — INFLUENZA VAC SPLIT QUAD 0.5 ML IM SUSY
0.5000 mL | PREFILLED_SYRINGE | INTRAMUSCULAR | Status: AC
  Administered 2014-08-14: 0.5 mL via INTRAMUSCULAR
  Filled 2014-08-14: qty 0.5

## 2014-08-14 MED ORDER — BUTAMBEN-TETRACAINE-BENZOCAINE 2-2-14 % EX AERO
INHALATION_SPRAY | CUTANEOUS | Status: DC | PRN
  Administered 2014-08-14: 2 via TOPICAL

## 2014-08-14 MED ORDER — SODIUM CHLORIDE 0.9 % IJ SOLN
3.0000 mL | Freq: Two times a day (BID) | INTRAMUSCULAR | Status: DC
  Administered 2014-08-14 – 2014-08-15 (×2): 3 mL via INTRAVENOUS

## 2014-08-14 MED ORDER — PANTOPRAZOLE SODIUM 40 MG IV SOLR
40.0000 mg | Freq: Two times a day (BID) | INTRAVENOUS | Status: DC
  Administered 2014-08-14 – 2014-08-15 (×3): 40 mg via INTRAVENOUS
  Filled 2014-08-14 (×4): qty 40

## 2014-08-14 MED ORDER — ONDANSETRON HCL 4 MG/2ML IJ SOLN: 4.0000 mg | Freq: Four times a day (QID) | INTRAMUSCULAR | Status: DC | PRN

## 2014-08-14 MED ORDER — PNEUMOCOCCAL VAC POLYVALENT 25 MCG/0.5ML IJ INJ
0.5000 mL | INJECTION | INTRAMUSCULAR | Status: AC
  Administered 2014-08-14: 0.5 mL via INTRAMUSCULAR
  Filled 2014-08-14: qty 0.5

## 2014-08-14 MED ORDER — SODIUM CHLORIDE 0.9 % IV SOLN
80.0000 mg | Freq: Once | INTRAVENOUS | Status: AC
  Administered 2014-08-14: 80 mg via INTRAVENOUS
  Filled 2014-08-14: qty 80

## 2014-08-14 MED ORDER — ACETAMINOPHEN 650 MG RE SUPP
650.0000 mg | Freq: Four times a day (QID) | RECTAL | Status: DC | PRN
Start: 2014-08-14 — End: 2014-08-15

## 2014-08-14 MED ORDER — FENTANYL CITRATE 0.05 MG/ML IJ SOLN
INTRAMUSCULAR | Status: AC
  Filled 2014-08-14: qty 2

## 2014-08-14 MED ORDER — DIPHENHYDRAMINE HCL 50 MG/ML IJ SOLN
INTRAMUSCULAR | Status: AC
  Filled 2014-08-14: qty 1

## 2014-08-14 MED ORDER — MIDAZOLAM HCL 10 MG/2ML IJ SOLN
INTRAMUSCULAR | Status: DC | PRN
  Administered 2014-08-14: 2 mg via INTRAVENOUS
  Administered 2014-08-14: 1 mg via INTRAVENOUS
  Administered 2014-08-14: 2 mg via INTRAVENOUS

## 2014-08-14 MED ORDER — ACETAMINOPHEN 325 MG PO TABS: 650.0000 mg | ORAL_TABLET | Freq: Four times a day (QID) | ORAL | Status: DC | PRN

## 2014-08-14 MED ORDER — SODIUM CHLORIDE 0.9 % IV SOLN: INTRAVENOUS | Status: DC

## 2014-08-14 MED ORDER — MIDAZOLAM HCL 5 MG/ML IJ SOLN
INTRAMUSCULAR | Status: AC
  Filled 2014-08-14: qty 2

## 2014-08-14 MED ORDER — SODIUM CHLORIDE 0.9 % IV SOLN
INTRAVENOUS | Status: DC
  Administered 2014-08-14: 02:00:00 via INTRAVENOUS

## 2014-08-14 MED ORDER — ONDANSETRON HCL 4 MG PO TABS: 4.0000 mg | ORAL_TABLET | Freq: Four times a day (QID) | ORAL | Status: DC | PRN

## 2014-08-14 MED ORDER — SODIUM CHLORIDE 0.9 % IV SOLN
INTRAVENOUS | Status: DC
  Administered 2014-08-14: 500 mL via INTRAVENOUS

## 2014-08-14 MED ORDER — NICOTINE 14 MG/24HR TD PT24
14.0000 mg | MEDICATED_PATCH | Freq: Every day | TRANSDERMAL | Status: DC
  Administered 2014-08-14 – 2014-08-15 (×2): 14 mg via TRANSDERMAL
  Filled 2014-08-14 (×3): qty 1

## 2014-08-14 NOTE — Interval H&P Note (Signed)
History and Physical Interval Note:  08/14/2014 10:14 AM  Kevin Chung  has presented today for surgery, with the diagnosis of melana  The various methods of treatment have been discussed with the patient and family. After consideration of risks, benefits and other options for treatment, the patient has consented to  Procedure(s): ESOPHAGOGASTRODUODENOSCOPY (EGD) (N/A) as a surgical intervention .  The patient's history has been reviewed, patient examined, no change in status, stable for surgery.  I have reviewed the patient's chart and labs.  Questions were answered to the patient's satisfaction.     Macon C.

## 2014-08-14 NOTE — H&P (View-Only) (Signed)
Referring Provider: Dr. Aileen Fass Primary Care Physician:  PROVIDER NOT [place]   Reason for Consultation:  Melena  HPI: Kevin Chung is a 54 y.o. male with acute onset of melena that started last Friday and occurred daily since then. +Dizziness. Denies any associated N/V/hematochezia/abdominal pain/diarrhea/hematemesis/rectal pain. Has been taking Advil daily recently. Denies previous history of bleeding. Reports an episode of heartburn several weeks ago but none recently. Reports negative colonoscopy several years ago. Hgb 7.7 (13 in Feb 2015).  Past Medical History  Diagnosis Date  . Hypertension   . GERD (gastroesophageal reflux disease)   . Cancer     Prostate  . Sleep apnea     STOP BANG SCORE 4  . Palpitations 2008    "neg" work up per patient    Past Surgical History  Procedure Laterality Date  . No past surgeries    . Robot assisted laparoscopic radical prostatectomy  04/13/2012    Procedure: ROBOTIC ASSISTED LAPAROSCOPIC RADICAL PROSTATECTOMY;  Surgeon: Bernestine Amass, MD;  Location: [facility];  Service: Urology;  Laterality: N/A;  BILATERAL PELVIC LYMPH NODE DISSECTION     Prior to Admission medications   Medication Sig Start Date End Date Taking? Authorizing Provider  aspirin EC 81 MG tablet Take 81 mg by mouth daily.   Yes Historical Provider, MD  ibuprofen (ADVIL,MOTRIN) 200 MG tablet Take 400 mg by mouth every 6 (six) hours as needed for fever or moderate pain.   Yes Historical Provider, MD  losartan-hydrochlorothiazide (HYZAAR) 100-25 MG per tablet Take 1 tablet by mouth daily. 10/23/13  Yes [name], MD    Scheduled Meds: . sodium chloride   Intravenous Once  . sodium chloride   Intravenous Once  . Influenza vac split quadrivalent PF  0.5 mL Intramuscular Tomorrow-1000  . nicotine  14 mg Transdermal Daily  . pantoprazole (PROTONIX) IV  40 mg Intravenous Q12H  . pneumococcal 23 valent vaccine  0.5 mL Intramuscular Tomorrow-1000  . sodium chloride  3 mL  Intravenous Q12H   Continuous Infusions: . sodium chloride 500 mL (08/14/14 0918)   PRN Meds:.acetaminophen **OR** acetaminophen, ondansetron **OR** ondansetron (ZOFRAN) IV  Allergies as of 08/13/2014  . (No Known Allergies)    History reviewed. No pertinent family history.  History   Social History  . Marital Status: Married    Spouse Name: N/A    Number of Children: N/A  . Years of Education: N/A   Occupational History  . [employer]   Social History Main Topics  . Smoking status: Current Every Day Smoker -- 1.00 packs/day for 25 years    Types: Cigarettes  . Smokeless tobacco: Never Used  . Alcohol Use: Yes     Comment: rarely  . Drug Use: No  . Sexual Activity: Not on file   Other Topics Concern  . Not on file   Social History Narrative   Lives alone, currently separated. Not aware of any CAD in siblings. Original leaflet from [place], he has been in the [place] over 20 years.    Review of Systems: All negative except as stated above in HPI.  Physical Exam: Vital signs: Filed Vitals:   08/14/14 0917  BP: 105/68  Pulse: 77  Temp: 99  Resp: 17   Last BM Date: 08/12/14 General:   Alert,  Well-developed, well-nourished, pleasant and cooperative in NAD HEENT: anicteric Lungs:  Clear throughout to auscultation.   No wheezes, crackles, or rhonchi. No acute distress. Heart:  Regular rate and  rhythm; no murmurs, clicks, rubs,  or gallops. Abdomen: soft, nontender, nondistended, +BS  Rectal:  Deferred Ext: no edema  GI:  Lab Results:  Recent Labs  08/13/14 2352 08/14/14 0845  WBC 6.8 5.2  HGB 7.7* 7.8*  HCT 23.0* 23.4*  PLT 153 130*   BMET  Recent Labs  08/13/14 2352 08/14/14 0845  NA 138 139  K 3.4* 3.9  CL 102 107  CO2 30 27  GLUCOSE 102* 88  BUN 17 15  CREATININE 1.06 1.12  CALCIUM 9.0 8.8   LFT  Recent Labs  08/14/14 0845  PROT 5.1*  ALBUMIN 2.9*  AST 25  ALT 17  ALKPHOS 51  BILITOT 0.8   PT/INR  Recent Labs   08/13/14 2352  LABPROT 13.6  INR 1.03     Studies/Results: No results found.  Impression/Plan: 54 yo with melena concerning for a peptic ulcer bleed in the setting of NSAID use. EGD this morning. Continue PPI infusion. Risks/benefits of EGD discussed and he agrees to proceed.    LOS: 0 days   Donley C.  08/14/2014, 10:05 AM

## 2014-08-14 NOTE — ED Provider Notes (Signed)
CSN: [id number]     Arrival date & time 08/13/14  2340 History   This chart was scribed for Wynetta Fines, MD by Chester Holstein, ED Scribe. This patient was seen in room B14C/B14C and the patient's care was started at 12:54 AM.    Chief Complaint  Patient presents with  . Melena    The history is provided by the patient. No language interpreter was used.    HPI Comments: Kevin Chung is a 54 y.o. male with PMHx of HTN, GERD, prostate Ca, and sleep apnea who presents to the Emergency Department complaining of melena (dark green/black stools), onset 4 days ago. Pt states associated symptoms began with dizziness; the next day symptoms progressed to headache, fatigue, and body aches. Pt thought he was coming down with the flu. Pt notes associated mild abdominal pain and active bowel sounds. Pt was seen at an [facility] walk-in clinic and was told to come to the ED when his hemoglobin was found to be low. Pt has been taking Nyquil and Advil without relief of his symptoms. Pt denies diarrhea, nausea, vomiting, fever, chills, chest pain and dyspnea.   Past Medical History  Diagnosis Date  . Hypertension   . GERD (gastroesophageal reflux disease)   . Cancer     Prostate  . Sleep apnea     STOP BANG SCORE 4  . Palpitations 2008    "neg" work up per patient   Past Surgical History  Procedure Laterality Date  . No past surgeries    . Robot assisted laparoscopic radical prostatectomy  04/13/2012    Procedure: ROBOTIC ASSISTED LAPAROSCOPIC RADICAL PROSTATECTOMY;  Surgeon: Bernestine Amass, MD;  Location: [facility];  Service: Urology;  Laterality: N/A;  BILATERAL PELVIC LYMPH NODE DISSECTION    History reviewed. No pertinent family history. History  Substance Use Topics  . Smoking status: Current Every Day Smoker -- 1.00 packs/day for 25 years    Types: Cigarettes  . Smokeless tobacco: Never Used  . Alcohol Use: Yes     Comment: rarely    Review of Systems  All other systems reviewed and are  negative.   Allergies  Review of patient's allergies indicates no known allergies.  Home Medications   Prior to Admission medications   Medication Sig Start Date End Date Taking? Authorizing Provider  aspirin EC 81 MG tablet Take 81 mg by mouth daily.   Yes Historical Provider, MD  ibuprofen (ADVIL,MOTRIN) 200 MG tablet Take 400 mg by mouth every 6 (six) hours as needed for fever or moderate pain.   Yes Historical Provider, MD  losartan-hydrochlorothiazide (HYZAAR) 100-25 MG per tablet Take 1 tablet by mouth daily. 10/23/13  Yes [name], MD   BP 97/63 mmHg  Pulse 77  Temp(Src) 99 F (37.2 C) (Oral)  Resp 16  Ht 6' (1.829 m)  Wt 149 lb 11.1 oz (67.9 kg)  BMI 20.30 kg/m2  SpO2 100%   Physical Exam General: Well-developed, well-nourished male in no acute distress; appearance consistent with age of record HENT: normocephalic; atraumatic, dental caries Eyes: pupils equal, round and reactive to light; extraocular muscles intact; conjunctival pallor  Neck: supple Heart: regular rate and rhythm; no murmurs, rubs or gallops Lungs: clear to auscultation bilaterally Abdomen: soft; nondistended; nontender; no masses or hepatosplenomegaly; bowel sounds present Rectal: Normal sphincter tone; stool on examining dark, heme positive Extremities: No deformity; full range of motion; pulses normal Neurologic: Awake, alert and oriented; motor function intact in all extremities and symmetric;  no facial droop Skin: Warm and dry Psychiatric: Normal mood and affect   ED Course  Procedures (including critical care time) DIAGNOSTIC STUDIES: Oxygen Saturation is 100% on room air, normal by my interpretation.    COORDINATION OF CARE: 1:02 AM Discussed treatment plan with patient at beside, the patient agrees with the plan and has no further questions at this time.     MDM   Nursing notes and vitals signs, including pulse oximetry, reviewed.  Summary of this visit's results, reviewed by  myself:  Labs:  Results for orders placed or performed during the hospital encounter of 08/14/14 (from the past 24 hour(s))  CBC with Differential     Status: Abnormal   Collection Time: 08/13/14 11:52 PM  Result Value Ref Range   WBC 6.8 4.0 - 10.5 K/uL   RBC 2.66 (L) 4.22 - 5.81 MIL/uL   Hemoglobin 7.7 (L) 13.0 - 17.0 g/dL   HCT 23.0 (L) 39.0 - 52.0 %   MCV 86.5 78.0 - 100.0 fL   MCH 28.9 26.0 - 34.0 pg   MCHC 33.5 30.0 - 36.0 g/dL   RDW 13.5 11.5 - 15.5 %   Platelets 153 150 - 400 K/uL   Neutrophils Relative % 61 43 - 77 %   Neutro Abs 4.1 1.7 - 7.7 K/uL   Lymphocytes Relative 31 12 - 46 %   Lymphs Abs 2.1 0.7 - 4.0 K/uL   Monocytes Relative 5 3 - 12 %   Monocytes Absolute 0.3 0.1 - 1.0 K/uL   Eosinophils Relative 3 0 - 5 %   Eosinophils Absolute 0.2 0.0 - 0.7 K/uL   Basophils Relative 0 0 - 1 %   Basophils Absolute 0.0 0.0 - 0.1 K/uL  Comprehensive metabolic panel     Status: Abnormal   Collection Time: 08/13/14 11:52 PM  Result Value Ref Range   Sodium 138 135 - 145 mmol/L   Potassium 3.4 (L) 3.5 - 5.1 mmol/L   Chloride 102 96 - 112 mmol/L   CO2 30 19 - 32 mmol/L   Glucose, Bld 102 (H) 70 - 99 mg/dL   BUN 17 6 - 23 mg/dL   Creatinine, Ser 1.06 0.50 - 1.35 mg/dL   Calcium 9.0 8.4 - 10.5 mg/dL   Total Protein 6.1 6.0 - 8.3 g/dL   Albumin 3.5 3.5 - 5.2 g/dL   AST 24 0 - 37 U/L   ALT 20 0 - 53 U/L   Alkaline Phosphatase 60 39 - 117 U/L   Total Bilirubin 0.5 0.3 - 1.2 mg/dL   GFR calc non Af Amer 77 (L) >90 mL/min   GFR calc Af Amer 89 (L) >90 mL/min   Anion gap 6 5 - 15  Protime-INR     Status: None   Collection Time: 08/13/14 11:52 PM  Result Value Ref Range   Prothrombin Time 13.6 11.6 - 15.2 seconds   INR 1.03 0.00 - 1.49  APTT     Status: None   Collection Time: 08/13/14 11:52 PM  Result Value Ref Range   aPTT 26 24 - 37 seconds  Type and screen     Status: None (Preliminary result)   Collection Time: 08/13/14 11:52 PM  Result Value Ref Range   ABO/RH(D)  O POS    Antibody Screen NEG    Sample Expiration 08/16/2014    Unit Number [id number]    Blood Component Type RCLI PHER 2    Unit division 00    Status of  Unit ISSUED    Transfusion Status OK TO TRANSFUSE    Crossmatch Result Compatible    Unit Number [id number]    Blood Component Type RED CELLS,LR    Unit division 00    Status of Unit ALLOCATED    Transfusion Status OK TO TRANSFUSE    Crossmatch Result Compatible   ABO/Rh     Status: None (Preliminary result)   Collection Time: 08/13/14 11:52 PM  Result Value Ref Range   ABO/RH(D) O POS   Sample to Blood Bank     Status: None   Collection Time: 08/13/14 11:53 PM  Result Value Ref Range   Blood Bank Specimen SAMPLE AVAILABLE FOR TESTING    Sample Expiration 08/15/2014   Prepare RBC     Status: None   Collection Time: 08/14/14  3:30 AM  Result Value Ref Range   Order Confirmation ORDER PROCESSED BY BLOOD BANK    Two units PRBC ordered for acute blood loss. Hospitalist to admit.   Final diagnoses:  Upper GI bleeding  Anemia due to acute blood loss   I personally performed the services described in this documentation, which was scribed in my presence. The recorded information has been reviewed and is accurate.   Wynetta Fines, MD 08/14/14 [phone]

## 2014-08-14 NOTE — Consult Note (Signed)
Referring Provider: Dr. Aileen Fass Primary Care Physician:  PROVIDER NOT [place]   Reason for Consultation:  Melena  HPI: Kevin Chung is a 54 y.o. male with acute onset of melena that started last Friday and occurred daily since then. +Dizziness. Denies any associated N/V/hematochezia/abdominal pain/diarrhea/hematemesis/rectal pain. Has been taking Advil daily recently. Denies previous history of bleeding. Reports an episode of heartburn several weeks ago but none recently. Reports negative colonoscopy several years ago. Hgb 7.7 (13 in Feb 2015).  Past Medical History  Diagnosis Date  . Hypertension   . GERD (gastroesophageal reflux disease)   . Cancer     Prostate  . Sleep apnea     STOP BANG SCORE 4  . Palpitations 2008    "neg" work up per patient    Past Surgical History  Procedure Laterality Date  . No past surgeries    . Robot assisted laparoscopic radical prostatectomy  04/13/2012    Procedure: ROBOTIC ASSISTED LAPAROSCOPIC RADICAL PROSTATECTOMY;  Surgeon: Bernestine Amass, MD;  Location: [facility];  Service: Urology;  Laterality: N/A;  BILATERAL PELVIC LYMPH NODE DISSECTION     Prior to Admission medications   Medication Sig Start Date End Date Taking? Authorizing Provider  aspirin EC 81 MG tablet Take 81 mg by mouth daily.   Yes Historical Provider, MD  ibuprofen (ADVIL,MOTRIN) 200 MG tablet Take 400 mg by mouth every 6 (six) hours as needed for fever or moderate pain.   Yes Historical Provider, MD  losartan-hydrochlorothiazide (HYZAAR) 100-25 MG per tablet Take 1 tablet by mouth daily. 10/23/13  Yes [name], MD    Scheduled Meds: . sodium chloride   Intravenous Once  . sodium chloride   Intravenous Once  . Influenza vac split quadrivalent PF  0.5 mL Intramuscular Tomorrow-1000  . nicotine  14 mg Transdermal Daily  . pantoprazole (PROTONIX) IV  40 mg Intravenous Q12H  . pneumococcal 23 valent vaccine  0.5 mL Intramuscular Tomorrow-1000  . sodium chloride  3 mL  Intravenous Q12H   Continuous Infusions: . sodium chloride 500 mL (08/14/14 0918)   PRN Meds:.acetaminophen **OR** acetaminophen, ondansetron **OR** ondansetron (ZOFRAN) IV  Allergies as of 08/13/2014  . (No Known Allergies)    History reviewed. No pertinent family history.  History   Social History  . Marital Status: Married    Spouse Name: N/A    Number of Children: N/A  . Years of Education: N/A   Occupational History  . [employer]   Social History Main Topics  . Smoking status: Current Every Day Smoker -- 1.00 packs/day for 25 years    Types: Cigarettes  . Smokeless tobacco: Never Used  . Alcohol Use: Yes     Comment: rarely  . Drug Use: No  . Sexual Activity: Not on file   Other Topics Concern  . Not on file   Social History Narrative   Lives alone, currently separated. Not aware of any CAD in siblings. Original leaflet from [place], he has been in the [place] over 20 years.    Review of Systems: All negative except as stated above in HPI.  Physical Exam: Vital signs: Filed Vitals:   08/14/14 0917  BP: 105/68  Pulse: 77  Temp: 99  Resp: 17   Last BM Date: 08/12/14 General:   Alert,  Well-developed, well-nourished, pleasant and cooperative in NAD HEENT: anicteric Lungs:  Clear throughout to auscultation.   No wheezes, crackles, or rhonchi. No acute distress. Heart:  Regular rate and  rhythm; no murmurs, clicks, rubs,  or gallops. Abdomen: soft, nontender, nondistended, +BS  Rectal:  Deferred Ext: no edema  GI:  Lab Results:  Recent Labs  08/13/14 2352 08/14/14 0845  WBC 6.8 5.2  HGB 7.7* 7.8*  HCT 23.0* 23.4*  PLT 153 130*   BMET  Recent Labs  08/13/14 2352 08/14/14 0845  NA 138 139  K 3.4* 3.9  CL 102 107  CO2 30 27  GLUCOSE 102* 88  BUN 17 15  CREATININE 1.06 1.12  CALCIUM 9.0 8.8   LFT  Recent Labs  08/14/14 0845  PROT 5.1*  ALBUMIN 2.9*  AST 25  ALT 17  ALKPHOS 51  BILITOT 0.8   PT/INR  Recent Labs   08/13/14 2352  LABPROT 13.6  INR 1.03     Studies/Results: No results found.  Impression/Plan: 54 yo with melena concerning for a peptic ulcer bleed in the setting of NSAID use. EGD this morning. Continue PPI infusion. Risks/benefits of EGD discussed and he agrees to proceed.    LOS: 0 days   Adair C.  08/14/2014, 10:05 AM

## 2014-08-14 NOTE — Progress Notes (Signed)
Report taken from [place], [place] in ED

## 2014-08-14 NOTE — H&P (Signed)
[facility] History and Physical  Patient: Kevin Chung  SJG:[id number]  DOB: 03-Jan-1961  DOS: the patient was seen and examined on 08/14/2014 PCP: PROVIDER NOT IN SYSTEM  Chief Complaint: dizziness  HPI: Kevin Chung is a 54 y.o. male with Past medical history of hypertension, GERD. The patient is presenting with complains of dizziness and lightheadedness along with weakness and fatigue. These symptoms have been ongoing since last Friday. He denies any similar symptoms prior to Friday. He has on and off acid reflux. Since Friday he has noted he has black color bowel movement without any diarrhea. He denies any active bleeding anywhere. He denies any fall or trauma. He uses aspirin 81 mg daily and occasionally ibuprofen. He denies any nausea at the time of my evaluation but did have some prior to his arrival. He also had some abdominal discomfort which is also absent at the time of my evaluation. No similar episodes of prior GI bleeding. He had a colonoscopy a few years ago done by [facility] and was unremarkable.  The patient is coming from home. And at his baseline independent for most of his ADL.  Review of Systems: as mentioned in the history of present illness.  A Comprehensive review of the other systems is negative.  Past Medical History  Diagnosis Date  . Hypertension   . GERD (gastroesophageal reflux disease)   . Cancer     Prostate  . Sleep apnea     STOP BANG SCORE 4  . Palpitations 2008    "neg" work up per patient   Past Surgical History  Procedure Laterality Date  . No past surgeries    . Robot assisted laparoscopic radical prostatectomy  04/13/2012    Procedure: ROBOTIC ASSISTED LAPAROSCOPIC RADICAL PROSTATECTOMY;  Surgeon: Bernestine Amass, MD;  Location: [facility];  Service: Urology;  Laterality: N/A;  BILATERAL PELVIC LYMPH NODE DISSECTION    Social History:  reports that he has been smoking Cigarettes.  He has a 25 pack-year smoking  history. He has never used smokeless tobacco. He reports that he drinks alcohol. He reports that he does not use illicit drugs.  No Known Allergies  History reviewed. No pertinent family history.  Prior to Admission medications   Medication Sig Start Date End Date Taking? Authorizing Provider  aspirin EC 81 MG tablet Take 81 mg by mouth daily.   Yes Historical Provider, MD  ibuprofen (ADVIL,MOTRIN) 200 MG tablet Take 400 mg by mouth every 6 (six) hours as needed for fever or moderate pain.   Yes Historical Provider, MD  losartan-hydrochlorothiazide (HYZAAR) 100-25 MG per tablet Take 1 tablet by mouth daily. 10/23/13  Yes [name], MD    Physical Exam: Filed Vitals:   08/14/14 0246 08/14/14 0341 08/14/14 0417 08/14/14 0539  BP: 103/69 98/65 107/77 99/54  Pulse: 81 77 92 79  Temp: 98.1 F (36.7 C) 98.2 F (36.8 C) 98 F (36.7 C) 98.2 F (36.8 C)  TempSrc: Oral Oral Oral Oral  Resp: 18 18 20 18   Height:  6' (1.829 m)    Weight:  67.9 kg (149 lb 11.1 oz)    SpO2: 100% 100% 100% 100%    General: Alert, Awake and Oriented to Time, Place and Person. Appear in mild distress Eyes: PERRL ENT: Oral Mucosa clear moist. Neck: no JVD Cardiovascular: S1 and S2 Present, no Murmur, Peripheral Pulses Present Respiratory: Bilateral Air entry equal and Decreased, Clear to Auscultation, noCrackles, no wheezes Abdomen: Bowel Sound present, Soft and non  tender Skin: no Rash Extremities: no Pedal edema, no calf tenderness Neurologic: Grossly no focal neuro deficit.  Labs on Admission:  CBC:  Recent Labs Lab 08/13/14 2352  WBC 6.8  NEUTROABS 4.1  HGB 7.7*  HCT 23.0*  MCV 86.5  PLT 153    CMP     Component Value Date/Time   NA 138 08/13/2014 2352   K 3.4* 08/13/2014 2352   CL 102 08/13/2014 2352   CO2 30 08/13/2014 2352   GLUCOSE 102* 08/13/2014 2352   BUN 17 08/13/2014 2352   CREATININE 1.06 08/13/2014 2352   CALCIUM 9.0 08/13/2014 2352   PROT 6.1 08/13/2014 2352    ALBUMIN 3.5 08/13/2014 2352   AST 24 08/13/2014 2352   ALT 20 08/13/2014 2352   ALKPHOS 60 08/13/2014 2352   BILITOT 0.5 08/13/2014 2352   GFRNONAA 77* 08/13/2014 2352   GFRAA 89* 08/13/2014 2352    No results for input(s): LIPASE, AMYLASE in the last 168 hours. No results for input(s): AMMONIA in the last 168 hours.  No results for input(s): CKTOTAL, CKMB, CKMBINDEX, TROPONINI in the last 168 hours. BNP (last 3 results)  Recent Labs  09/16/13 0305  PROBNP 43.5    Radiological Exams on Admission: No results found.  Assessment/Plan Principal Problem:   Melena Active Problems:   HTN (hypertension)   Tobacco abuse   Upper GI bleeding   1. Melena The patient is presenting with complaints of black color bowel movement. Along with that he is also found anemic. He is hemodynamically stable at present. But due to his symptoms he is receiving one unit of PRBC. I will recheck his CBC after the transfusion. She will remain nothing by mouth except medication. [facility] gastroenterology has been consulted. Patient will be receiving Protonix every 12 hours.  2. Hypertension Holding his antihypertensive medications at present  3. Tobacco abuse. Nicotine patch at present.  Advance goals of care discussion: full code   [facility]   DVT Prophylaxis: mechanical compression device Nutrition: npo  Disposition: Admitted to inpatient in telemetry unit.  Author: Berle Mull, MD [facility] Pager: [phone] 08/14/2014, 6:33 AM    If 7PM-7AM, please contact night-coverage [email or web address] Password [email or web address]

## 2014-08-14 NOTE — Progress Notes (Signed)
BP 105/68 mmHg  Pulse 77  Temp(Src) 99 F (37.2 C) (Oral)  Resp 17  Ht 6' (1.829 m)  Wt 67.9 kg (149 lb 11.1 oz)  BMI 20.30 kg/m2  SpO2 100%   Acute GI bleed: - I agree with starting patient on Protonix IV every 12 hours, gastroenterology to see. - Getting 1 unit of packed red blood cells. EGD scheduled for 08/14/2014. - Most likely due to NSAID use.  Essential hypertension: - Hold all antihypertensive medications.

## 2014-08-14 NOTE — Op Note (Signed)
[facility] [place], [id number]   ENDOSCOPY PROCEDURE REPORT  PATIENT: Kevin Chung, Kevin Chung  MR#: [id number] BIRTHDATE: [birth year] , 37  yrs. old GENDER: male ENDOSCOPIST: Wilford Corner, MD REFERRED BY: PROCEDURE DATE:  2014-08-26 PROCEDURE:  EGD, diagnostic ASA CLASS:     Class III INDICATIONS:  melena. MEDICATIONS: Fentanyl 50 mcg IV and Versed 5 mg IV TOPICAL ANESTHETIC: Cetacaine Spray  DESCRIPTION OF PROCEDURE: After the risks benefits and alternatives of the procedure were thoroughly explained, informed consent was obtained.  The [facility] [id number]) endoscope was introduced through the mouth and advanced to the second portion of the duodenum , Without limitations.  The instrument was slowly withdrawn as the mucosa was fully examined.    Esophagus revealed minimal edema consistent with minimal esophagitis. Stomach normal in appearance. A 8 mm clean-based ulcer noted in the duodenal bulb. 2nd part of the duodenum normal in appearance.       Retroflexed views revealed no abnormalities. The scope was then withdrawn from the patient and the procedure completed.  COMPLICATIONS: There were no immediate complications.  ENDOSCOPIC IMPRESSION:     Clean-based duodenal bulb ulcer (see above) MInimal esophagitis  RECOMMENDATIONS:     IV PPI Q 12 hours; Clear liquid diet and advance as tolerated; Avoid NSAIDs; Check H. pylori serology    eSigned:  Wilford Corner, MD 08/26/2014 10:45 AM    CC:  CPT CODES: ICD CODES:  The ICD and CPT codes recommended by this software are interpretations from the data that the clinical staff has captured with the software.  The verification of the translation of this report to the ICD and CPT codes and modifiers is the sole responsibility of the health care institution and practicing physician where this report was generated.  [place]. will not be held responsible for  the validity of the ICD and CPT codes included on this report.  AMA assumes no liability for data contained or not contained herein. CPT is a Designer, television/film set of the [employer].  PATIENT NAME:  [name], [name] MR#: [id number]

## 2014-08-14 NOTE — Progress Notes (Signed)
Patient admitted to the unit via stretcher. Pt alert and oriented x 4 at this time. Skin intact. IV's intact. Oriented to room and unit. Call bell within reach, pt understands how to get into contact with nursing staff. [facility] admissions paged. Awaiting MD orders.

## 2014-08-14 NOTE — Progress Notes (Signed)
Dr. Posey Pronto at bedside. Ordered for blood transfusion to to continue over 3 hours - rate set to 134mL/hr per MD. MD also stated to only give first unit of blood at this time. Will continue to monitor.

## 2014-08-14 NOTE — Progress Notes (Signed)
  08/14/2014 1530 Chart reviewed. UR completed. Jonnie Finner RN CCM Case Mgmt phone [phone]

## 2014-08-15 ENCOUNTER — Encounter (HOSPITAL_COMMUNITY): Payer: Self-pay | Admitting: Gastroenterology

## 2014-08-15 LAB — HEMOGLOBIN AND HEMATOCRIT, BLOOD
HCT: 22.5 % — ABNORMAL LOW (ref 39.0–52.0)
Hemoglobin: 7.7 g/dL — ABNORMAL LOW (ref 13.0–17.0)

## 2014-08-15 LAB — H. PYLORI ANTIBODY, IGG: H Pylori IgG: 5.8 U/mL — ABNORMAL HIGH (ref 0.0–0.8)

## 2014-08-15 MED ORDER — AMOXICILLIN 250 MG PO CHEW: 1000.0000 mg | CHEWABLE_TABLET | Freq: Two times a day (BID) | ORAL | Status: DC

## 2014-08-15 MED ORDER — AMOXICILLIN 500 MG PO CAPS
1000.0000 mg | ORAL_CAPSULE | Freq: Two times a day (BID) | ORAL | Status: DC
  Administered 2014-08-15: 1000 mg via ORAL
  Filled 2014-08-15 (×2): qty 2

## 2014-08-15 MED ORDER — CLARITHROMYCIN 500 MG PO TABS: 500.0000 mg | ORAL_TABLET | Freq: Two times a day (BID) | ORAL | Status: DC

## 2014-08-15 MED ORDER — CLARITHROMYCIN 500 MG PO TABS
500.0000 mg | ORAL_TABLET | Freq: Two times a day (BID) | ORAL | Status: DC
Start: 2014-08-15 — End: 2014-08-15
  Administered 2014-08-15: 500 mg via ORAL
  Filled 2014-08-15 (×2): qty 1

## 2014-08-15 MED ORDER — PANTOPRAZOLE SODIUM 40 MG PO TBEC: 40.0000 mg | DELAYED_RELEASE_TABLET | Freq: Two times a day (BID) | ORAL | Status: DC

## 2014-08-15 NOTE — Discharge Instructions (Signed)
Follow with Primary MD in 7 days  ° °Get CBC, CMP, 2 view Chest X ray checked  by Primary MD next visit.  ° ° °Activity: As tolerated with Full fall precautions use walker/cane & assistance as needed ° ° °Disposition Home   ° ° °Diet: Heart Healthy   ° °For Heart failure patients - Check your Weight same time everyday, if you gain over 2 pounds, or you develop in leg swelling, experience more shortness of breath or chest pain, call your Primary MD immediately. Follow Cardiac Low Salt Diet and 1.8 lit/day fluid restriction. ° ° °On your next visit with your primary care physician please Get Medicines reviewed and adjusted. ° ° °Please request your Prim.MD to go over all Hospital Tests and Procedure/Radiological results at the follow up, please get all Hospital records sent to your Prim MD by signing hospital release before you go home. ° ° °If you experience worsening of your admission symptoms, develop shortness of breath, life threatening emergency, suicidal or homicidal thoughts you must seek medical attention immediately by calling 911 or calling your MD immediately  if symptoms less severe. ° °You Must read complete instructions/literature along with all the possible adverse reactions/side effects for all the Medicines you take and that have been prescribed to you. Take any new Medicines after you have completely understood and accpet all the possible adverse reactions/side effects.  ° °Do not drive, operating heavy machinery, perform activities at heights, swimming or participation in water activities or provide baby sitting services if your were admitted for syncope or siezures until you have seen by Primary MD or a Neurologist and advised to do so again. ° °Do not drive when taking Pain medications.  ° ° °Do not take more than prescribed Pain, Sleep and Anxiety Medications ° °Special Instructions: If you have smoked or chewed Tobacco  in the last 2 yrs please stop smoking, stop any regular Alcohol  and or any  Recreational drug use. ° °Wear Seat belts while driving. ° ° °Please note ° °You were cared for by a hospitalist during your hospital stay. If you have any questions about your discharge medications or the care you received while you were in the hospital after you are discharged, you can call the unit and asked to speak with the hospitalist on call if the hospitalist that took care of you is not available. Once you are discharged, your primary care physician will handle any further medical issues. Please note that NO REFILLS for any discharge medications will be authorized once you are discharged, as it is imperative that you return to your primary care physician (or establish a relationship with a primary care physician if you do not have one) for your aftercare needs so that they can reassess your need for medications and monitor your lab values. ° ° °

## 2014-08-15 NOTE — Discharge Summary (Signed)
Neale Burly Vanessen to be D/C'd Home per MD order.  Discussed with the patient and all questions fully answered.    Medication List    STOP taking these medications        aspirin EC 81 MG tablet     ibuprofen 200 MG tablet  Commonly known as:  ADVIL,MOTRIN      TAKE these medications        amoxicillin 250 MG chewable tablet  Commonly known as:  AMOXIL  Chew 4 tablets (1,000 mg total) by mouth every 12 (twelve) hours.     clarithromycin 500 MG tablet  Commonly known as:  BIAXIN  Take 1 tablet (500 mg total) by mouth every 12 (twelve) hours.     losartan-hydrochlorothiazide 100-25 MG per tablet  Commonly known as:  HYZAAR  Take 1 tablet by mouth daily.     pantoprazole 40 MG tablet  Commonly known as:  PROTONIX  Take 1 tablet (40 mg total) by mouth 2 (two) times daily.        VVS, Skin clean, dry and intact without evidence of skin break down, no evidence of skin tears noted. IV catheter discontinued intact. Site without signs and symptoms of complications. Dressing and pressure applied.  An After Visit Summary was printed and given to the patient.  D/c education completed with patient/family including follow up instructions, medication list, d/c activities limitations if indicated, with other d/c instructions as indicated by MD - patient able to verbalize understanding, all questions fully answered.   Patient instructed to return to ED, call 911, or call MD for any changes in condition.   Patient escorted via [place], and D/C home via private auto.  Audria Nine F 08/15/2014 2:16 PM

## 2014-08-15 NOTE — Discharge Summary (Signed)
Kevin Chung, is a 54 y.o. male  DOB [birth year]  MRN [id number].  Admission date:  08/14/2014  Admitting Physician  Berle Mull, MD  Discharge Date:  08/15/2014   Primary MD  PROVIDER NOT IN SYSTEM  Recommendations for primary care physician for things to follow:   Monitor CBC and BMP closely.   Admission Diagnosis  Anemia due to acute blood loss [D62] Upper GI bleeding [K92.2]   Discharge Diagnosis  Anemia due to acute blood loss [D62] Upper GI bleeding [K92.2]     Principal Problem:   Melena Active Problems:   HTN (hypertension)   Tobacco abuse   Upper GI bleeding      Past Medical History  Diagnosis Date  . Hypertension   . GERD (gastroesophageal reflux disease)   . Cancer     Prostate  . Sleep apnea     STOP BANG SCORE 4  . Palpitations 2008    "neg" work up per patient    Past Surgical History  Procedure Laterality Date  . No past surgeries    . Robot assisted laparoscopic radical prostatectomy  04/13/2012    Procedure: ROBOTIC ASSISTED LAPAROSCOPIC RADICAL PROSTATECTOMY;  Surgeon: Bernestine Amass, MD;  Location: [facility];  Service: Urology;  Laterality: N/A;  BILATERAL PELVIC LYMPH NODE DISSECTION   . Esophagogastroduodenoscopy N/A 08/14/2014    Procedure: ESOPHAGOGASTRODUODENOSCOPY (EGD);  Surgeon: Lear Ng, MD;  Location: [facility] ENDOSCOPY;  Service: Endoscopy;  Laterality: N/A;       History of present illness and  Hospital Course:     Kindly see H&P for history of present illness and admission details, please review complete Labs, Consult reports and Test reports for all details in brief  HPI  from the history and physical done on the day of admission    Hospital Course    1. Acute upper GI bleed with blood loss rated anemia. Due to duodenal ulcer caused by comminution of NSAID use along  with possible H. pylori infection as H. pylori IgG is positive. Received 1 unit of packed RBC with stable H&H, no further bleed, tolerating diet, discussed with GI physician Dr. Michail Sermon cleared to go home. Underwent EGD yesterday showing duodenal ulcer with minimal esophagitis. Patient counseled to refrain from NSAID use. Placed on 2 weeks of oral amoxicillin and Claritin with erythromycin along with 3 months of PPI twice a day as per GI recommendation.   2. Essential hypertension. Resume home meds.   Discharge Condition: Stable   Follow UP  Follow-up Information    Follow up with [facility]    . Schedule an appointment as soon as possible for a visit in 1 week.   Contact information:   [address] [place] [address] [phone]      Follow up with Lear Ng., MD. Schedule an appointment as soon as possible for a visit in 2 weeks.   Specialty:  Gastroenterology   Contact information:   [address] [place]  [place] [address] [phone]         Discharge Instructions  and  Discharge Medications      Discharge Instructions    Diet - low sodium heart healthy    Complete by:  As directed      Discharge instructions    Complete by:  As directed   Follow with Primary MD in 7 days   Get CBC, CMP, 2 view Chest X ray checked  by Primary MD next visit.    Activity: As tolerated with Full fall precautions use walker/cane & assistance as needed   Disposition Home   Diet: Heart Healthy    For Heart failure patients - Check your Weight same time everyday, if you gain over 2 pounds, or you develop in leg swelling, experience more shortness of breath or chest pain, call your Primary MD immediately. Follow Cardiac Low Salt Diet and 1.8 lit/day fluid restriction.   On your next visit with your primary care physician please Get Medicines reviewed and adjusted.   Please request your Prim.MD to go over  all Hospital Tests and Procedure/Radiological results at the follow up, please get all Hospital records sent to your Prim MD by signing hospital release before you go home.   If you experience worsening of your admission symptoms, develop shortness of breath, life threatening emergency, suicidal or homicidal thoughts you must seek medical attention immediately by calling 911 or calling your MD immediately  if symptoms less severe.  You Must read complete instructions/literature along with all the possible adverse reactions/side effects for all the Medicines you take and that have been prescribed to you. Take any new Medicines after you have completely understood and accpet all the possible adverse reactions/side effects.   Do not drive, operating heavy machinery, perform activities at heights, swimming or participation in water activities or provide baby sitting services if your were admitted for syncope or siezures until you have seen by Primary MD or a Neurologist and advised to do so again.  Do not drive when taking Pain medications.    Do not take more than prescribed Pain, Sleep and Anxiety Medications  Special Instructions: If you have smoked or chewed Tobacco  in the last 2 yrs please stop smoking, stop any regular Alcohol  and or any Recreational drug use.  Wear Seat belts while driving.   Please note  You were cared for by a hospitalist during your hospital stay. If you have any questions about your discharge medications or the care you received while you were in the hospital after you are discharged, you can call the unit and asked to speak with the hospitalist on call if the hospitalist that took care of you is not available. Once you are discharged, your primary care physician will handle any further medical issues. Please note that NO REFILLS for any discharge medications will be authorized once you are discharged, as it is imperative that you return to your primary care physician  (or establish a relationship with a primary care physician if you do not have one) for your aftercare needs so that they can reassess your need for medications and monitor your lab values.     Increase activity slowly    Complete by:  As directed             Medication List    STOP taking these medications        aspirin EC 81 MG tablet     ibuprofen 200 MG tablet  Commonly known as:  ADVIL,MOTRIN      TAKE these medications        amoxicillin 250 MG chewable tablet  Commonly known as:  AMOXIL  Chew 4 tablets (1,000 mg total) by mouth every 12 (twelve) hours.     clarithromycin 500 MG tablet  Commonly known as:  BIAXIN  Take 1 tablet (500 mg total) by mouth every 12 (twelve) hours.     losartan-hydrochlorothiazide 100-25 MG per tablet  Commonly known as:  HYZAAR  Take 1 tablet by mouth daily.     pantoprazole 40 MG tablet  Commonly known as:  PROTONIX  Take 1 tablet (40 mg total) by mouth 2 (two) times daily.          Diet and Activity recommendation: See Discharge Instructions above   Consults obtained - GI Dr. Michail Sermon   Major procedures and Radiology Reports - PLEASE review detailed and final reports for all details, in brief -   EGD - duodenal ulcer mild esophagitis   No results found.  Micro Results      No results found for this or any previous visit (from the past 240 hour(s)).     Today   Subjective:   Kevin Chung today has no headache,no chest abdominal pain,no new weakness tingling or numbness, feels much better wants to go home today.    Objective:   Blood pressure 105/75, pulse 79, temperature 98.6 F (37 C), temperature source Oral, resp. rate 15, height 6' (1.829 m), weight 69.355 kg (152 lb 14.4 oz), SpO2 100 %.   Intake/Output Summary (Last 24 hours) at 08/15/14 1131 Last data filed at 08/15/14 8338  Gross per 24 hour  Intake    480 ml  Output      0 ml  Net    480 ml    Exam Awake Alert, Oriented x 3, No new F.N  deficits, Normal affect [place].AT,PERRAL Supple Neck,No JVD, No cervical lymphadenopathy appriciated.  Symmetrical Chest wall movement, Good air movement bilaterally, CTAB RRR,No Gallops,Rubs or new Murmurs, No Parasternal Heave +ve B.Sounds, Abd Soft, Non tender, No organomegaly appriciated, No rebound -guarding or rigidity. No Cyanosis, Clubbing or edema, No new Rash or bruise  Data Review   CBC w Diff: Lab Results  Component Value Date   WBC 5.2 08/14/2014   HGB 7.7* 08/15/2014   HCT 22.5* 08/15/2014   PLT 130* 08/14/2014   LYMPHOPCT 31 08/13/2014   MONOPCT 5 08/13/2014   EOSPCT 3 08/13/2014   BASOPCT 0 08/13/2014    CMP: Lab Results  Component Value Date   NA 139 08/14/2014   K 3.9 08/14/2014   CL 107 08/14/2014   CO2 27 08/14/2014   BUN 15 08/14/2014   CREATININE 1.12 08/14/2014   PROT 5.1* 08/14/2014   ALBUMIN 2.9* 08/14/2014   BILITOT 0.8 08/14/2014   ALKPHOS 51 08/14/2014   AST 25 08/14/2014   ALT 17 08/14/2014  .   Total Time in preparing paper work, data evaluation and todays exam - 35 minutes  Thurnell Lose M.D on 08/15/2014 at 11:31 AM  [facility] Office  [phone]

## 2014-08-16 LAB — TYPE AND SCREEN
ABO/RH(D): O POS
ANTIBODY SCREEN: NEGATIVE
UNIT DIVISION: 0
UNIT DIVISION: 0

## 2014-08-27 ENCOUNTER — Ambulatory Visit: Payer: Managed Care, Other (non HMO) | Attending: Family Medicine | Admitting: Physician Assistant

## 2014-08-27 ENCOUNTER — Encounter: Payer: Self-pay | Admitting: Physician Assistant

## 2014-08-27 VITALS — BP 131/86 | HR 88 | Temp 98.0°F | Resp 18 | Ht 72.0 in | Wt 162.0 lb

## 2014-08-27 DIAGNOSIS — F17200 Nicotine dependence, unspecified, uncomplicated: Secondary | ICD-10-CM | POA: Diagnosis not present

## 2014-08-27 DIAGNOSIS — Z9079 Acquired absence of other genital organ(s): Secondary | ICD-10-CM | POA: Insufficient documentation

## 2014-08-27 DIAGNOSIS — Z792 Long term (current) use of antibiotics: Secondary | ICD-10-CM | POA: Insufficient documentation

## 2014-08-27 DIAGNOSIS — K219 Gastro-esophageal reflux disease without esophagitis: Secondary | ICD-10-CM | POA: Insufficient documentation

## 2014-08-27 DIAGNOSIS — K922 Gastrointestinal hemorrhage, unspecified: Secondary | ICD-10-CM

## 2014-08-27 DIAGNOSIS — Z79899 Other long term (current) drug therapy: Secondary | ICD-10-CM | POA: Insufficient documentation

## 2014-08-27 LAB — HEMOGLOBIN AND HEMATOCRIT, BLOOD
HCT: 27.8 % — ABNORMAL LOW (ref 39.0–52.0)
Hemoglobin: 8.7 g/dL — ABNORMAL LOW (ref 13.0–17.0)

## 2014-08-27 NOTE — Progress Notes (Signed)
Pt presents to clinic for follow up. No complaint.

## 2014-08-27 NOTE — Progress Notes (Signed)
Kevin Chung  NOM:[id number]  JGG:[id number]  DOB - [birth year]  Chief Complaint  Patient presents with  . Hospitalization Follow-up       Subjective:   Kevin Chung is a 54 y.o. male here today for establishment of care. He was hospitalized from 08/14/2014 through 08/15/2014 for upper GI bleed. He initially presented with dizziness and lightheadedness. He also had noted some dark stools. He had been using increased anti-inflammatories for headaches. He was transfused 1 unit of blood. He had a upper GI endoscopy which showed a duodenal ulcer with minimal esophagitis. He also had possible H. pylori. He has completed his antibiotics. He continues on twice daily omeprazole.  Since discharge he denies any blood in his stool. He denies any lightheadedness or dizziness. He denies any chest pain or shortness of breath. He denies any abdominal pain, nausea or vomiting.    ROS: GEN: denies fever or chills, denies change in weight Skin: denies lesions or rashes HEENT: denies headache, earache, epistaxis, sore throat, or neck pain LUNGS: denies SHOB, dyspnea, PND, orthopnea CV: denies CP or palpitations ABD: denies abd pain, N or V EXT: denies muscle spasms or swelling; no pain in lower ext, no weakness NEURO: denies numbness or tingling, denies sz, stroke or TIA  No problems updated.  ALLERGIES: No Known Allergies  PAST MEDICAL HISTORY: Past Medical History  Diagnosis Date  . Hypertension   . GERD (gastroesophageal reflux disease)   . Cancer     Prostate  . Sleep apnea     STOP BANG SCORE 4  . Palpitations 2008    "neg" work up per patient    PAST SURGICAL HISTORY: Past Surgical History  Procedure Laterality Date  . No past surgeries    . Robot assisted laparoscopic radical prostatectomy  04/13/2012    Procedure: ROBOTIC ASSISTED LAPAROSCOPIC RADICAL PROSTATECTOMY;  Surgeon: Bernestine Amass, MD;  Location: [facility];  Service: Urology;  Laterality: N/A;  BILATERAL PELVIC LYMPH  NODE DISSECTION   . Esophagogastroduodenoscopy N/A 08/14/2014    Procedure: ESOPHAGOGASTRODUODENOSCOPY (EGD);  Surgeon: Lear Ng, MD;  Location: [facility] ENDOSCOPY;  Service: Endoscopy;  Laterality: N/A;    MEDICATIONS AT HOME: Prior to Admission medications   Medication Sig Start Date End Date Taking? Authorizing Provider  amoxicillin (AMOXIL) 250 MG chewable tablet Chew 4 tablets (1,000 mg total) by mouth every 12 (twelve) hours. 08/15/14   Thurnell Lose, MD  clarithromycin (BIAXIN) 500 MG tablet Take 1 tablet (500 mg total) by mouth every 12 (twelve) hours. 08/15/14   Thurnell Lose, MD  losartan-hydrochlorothiazide (HYZAAR) 100-25 MG per tablet Take 1 tablet by mouth daily. 10/23/13   [name], MD  pantoprazole (PROTONIX) 40 MG tablet Take 1 tablet (40 mg total) by mouth 2 (two) times daily. 08/15/14   Thurnell Lose, MD     Objective:   Filed Vitals:   08/27/14 0926  BP: 131/86  Pulse: 88  Temp: 98 F (36.7 C)  TempSrc: Oral  Resp: 18  Height: 6' (1.829 m)  Weight: 162 lb (73.483 kg)  SpO2: 100%    Exam General appearance : Awake, alert, not in any distress. Speech Clear. Not toxic looking HEENT: Atraumatic and Normocephalic, pupils equally reactive to light and accomodation Neck: supple, no JVD. No cervical lymphadenopathy.  Chest:Good air entry bilaterally, no added sounds  CVS: S1 S2 regular, no murmurs.  Abdomen: Bowel sounds present, Non tender and not distended with no gaurding, rigidity or rebound. Extremities: B/L Lower  Ext shows no edema, both legs are warm to touch Neurology: Awake alert, and oriented X 3, CN II-XII intact, Non focal Skin:No Rash Wounds:N/A  Data Review No results found for: HGBA1C   Assessment & Plan  1. Upper GI bleed  -Antibiotics completed. Continue twice-daily omeprazole.  -Continue to keep an appointment with gastroenterology in 2 weeks  -Avoid anti-inflammatories and alcohol  -Check H&H today 2.  Smoker  -Cessation discussed    He has a primary care provider through [employer] and wishes to continue his care there. Encouraged to see them in 6-8 weeks.   The patient was given clear instructions to go to ER or return to medical center if symptoms don't improve, worsen or new problems develop. The patient verbalized understanding. The patient was told to call to get lab results if they haven't heard anything in the next week.   This note has been created with Surveyor, quantity. Any transcriptional errors are unintentional.    Zettie Pho, PA-C [facility] [place], [place]   08/27/2014, 10:54 AM

## 2014-08-27 NOTE — Patient Instructions (Signed)
Stop smoking For minor aches and pains take a Tylenol based products instead of ibuprofen Keep appointment with the gastroenterologist next week See your primary doctor in the next 1-2 months Continue taking her omeprazole Okay to return to work

## 2014-09-21 ENCOUNTER — Other Ambulatory Visit: Payer: Self-pay | Admitting: Cardiovascular Disease

## 2014-12-20 ENCOUNTER — Other Ambulatory Visit: Payer: Self-pay

## 2018-01-17 ENCOUNTER — Encounter: Payer: Self-pay | Admitting: Endocrinology

## 2018-01-19 ENCOUNTER — Encounter: Payer: Self-pay | Admitting: Internal Medicine

## 2018-01-21 ENCOUNTER — Other Ambulatory Visit: Payer: Managed Care, Other (non HMO)

## 2018-01-21 ENCOUNTER — Other Ambulatory Visit: Payer: Self-pay | Admitting: Family Medicine

## 2018-01-21 DIAGNOSIS — E039 Hypothyroidism, unspecified: Secondary | ICD-10-CM

## 2018-01-21 DIAGNOSIS — R221 Localized swelling, mass and lump, neck: Secondary | ICD-10-CM

## 2018-02-10 ENCOUNTER — Ambulatory Visit: Payer: Managed Care, Other (non HMO) | Admitting: Internal Medicine

## 2018-02-10 ENCOUNTER — Encounter: Payer: Self-pay | Admitting: Internal Medicine

## 2018-02-10 ENCOUNTER — Encounter (INDEPENDENT_AMBULATORY_CARE_PROVIDER_SITE_OTHER): Payer: Self-pay

## 2018-02-10 VITALS — BP 132/90 | HR 96 | Ht 70.75 in | Wt 155.2 lb

## 2018-02-10 DIAGNOSIS — M542 Cervicalgia: Secondary | ICD-10-CM | POA: Diagnosis not present

## 2018-02-10 DIAGNOSIS — R070 Pain in throat: Secondary | ICD-10-CM

## 2018-02-10 DIAGNOSIS — R1310 Dysphagia, unspecified: Secondary | ICD-10-CM

## 2018-02-10 NOTE — Progress Notes (Signed)
HISTORY OF PRESENT ILLNESS:  Kevin Chung is a 57 y.o. 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 Maurice Small regarding dysphagia. Patient was evaluated by Dr. Justin Mend 01/17/2018 with complaints of low-grade fever, headaches, fatigue, and trouble swallowing which bothered him for several weeks. His description was that it felt like "something was stuck in his throat". But her taste in his mouth but food doesn't NOT get stuck. Rather some discomfort when swallowing. The patient was placed on omeprazole which she took for while. His symptoms resolved. He discontinued omeprazole without recurrent symptoms. He does have a history with [facility]. Not sure why he was sent here. That history includes clean-based duodenal ulcer in January 2016. Also office note from Dr. Michail Sermon February 2016 at which time colonoscopy was planned for iron deficiency anemia. Patient tells me that he had that examination completed though I have no record. In terms of his complaints, the patient tells me that he was experiencing neck soreness that was worse with movements of his head from side to side. This has resolved. He has absolutely no GI symptoms at this time except for sensation of dry throat. Though he is quite thin, no weight loss. he is a chronic smoker. In addition to reviewing the outside office note and endoscopy report, outside laboratories from this month show low TSH, mildly elevated glucose of 104. Otherwise normal conference a metabolic panel. Mild anemia with hemoglobin 11.9. Normal MCV.  REVIEW OF SYSTEMS:  All non-GI ROS negative unless otherwise stated in the history of present illness except for fatigue, excessive urination  Past Medical History:  Diagnosis Date  . Anemia   . GERD (gastroesophageal reflux disease)   . Hypertension   . Palpitations 2008   "neg" work up per patient  . Prostate cancer ([place])   . Sleep apnea    STOP BANG SCORE 4    Past  Surgical History:  Procedure Laterality Date  . ESOPHAGOGASTRODUODENOSCOPY N/A 08/14/2014   Procedure: ESOPHAGOGASTRODUODENOSCOPY (EGD);  Surgeon: Lear Ng, MD;  Location: [facility] ENDOSCOPY;  Service: Endoscopy;  Laterality: N/A;  . ROBOT ASSISTED LAPAROSCOPIC RADICAL PROSTATECTOMY  04/13/2012   Procedure: ROBOTIC ASSISTED LAPAROSCOPIC RADICAL PROSTATECTOMY;  Surgeon: Bernestine Amass, MD;  Location: [facility];  Service: Urology;  Laterality: N/A;  BILATERAL PELVIC LYMPH NODE DISSECTION     Social History Shamarr Faucett  reports that he has been smoking cigarettes.  He has a 25.00 pack-year smoking history. He has never used smokeless tobacco. He reports that he drinks alcohol. He reports that he does not use drugs.  family history includes Diabetes in his paternal grandmother; Hypertension in his maternal grandmother; Prostate cancer in his father.  No Known Allergies     PHYSICAL EXAMINATION: Vital signs: BP 132/90 (BP Location: Left Arm, Patient Position: Sitting, Cuff Size: Normal)   Pulse 96   Ht 5' 10.75" (1.797 m) Comment: height measured without shoes  Wt 155 lb 4 oz (70.4 kg)   BMI 21.81 kg/m   Constitutional: generally well-appearing, no acute distress Psychiatric: alert and oriented x3, cooperative Eyes: extraocular movements intact, anicteric, conjunctiva pink Mouth: oral pharynx moist, no lesions. No obvious abnormalities of the posterior pharynx though exam limited Neck: supple no lymphadenopathy. No tenderness Cardiovascular: heart regular rate and rhythm, no murmur Lungs: clear to auscultation bilaterally Abdomen: soft, nontender, nondistended, no obvious ascites, no peritoneal signs, normal bowel sounds, no organomegaly Rectal:omitted Extremities: no clubbing, cyanosis, orlower extremity  edema bilaterally Skin: no lesions on visible extremities Neuro: No focal deficits. Cranial nerves intact  ASSESSMENT:  #1. Recent problems with neck discomfort most consistent  with musculoskeletal. Resolved #2. Issues with discomfort swallowing improved after PPI. No recurrent symptoms off PPI. No true esophageal dysphagia #3. History of duodenal ulcer 2016 with Dr. Michail Sermon #4. History of iron deficiency anemia. Apparently underwent colonoscopy with Dr. Michail Sermon. Requested  PLAN:  #1. Barium esophagram to rule out esophageal abnormalities #2. If patient continues with any throat complaints (none at this time), then he needs ENT evaluation, which can be arranged by his PCP. He understands #3. Requested outside records from [place]  Copies consultation note has been sent to Dr. Justin Mend

## 2018-02-10 NOTE — Patient Instructions (Signed)
You have been scheduled for a Barium Esophogram at [facility] (1st floor of the hospital) on 02/16/2018 at 9:30am. Please arrive 15 minutes prior to your appointment for registration. Make certain not to have anything to eat or drink 3 hours prior to your test. If you need to reschedule for any reason, please contact radiology at [phone] to do so. __________________________________________________________________ A barium swallow is an examination that concentrates on views of the esophagus. This tends to be a double contrast exam (barium and two liquids which, when combined, create a gas to distend the wall of the oesophagus) or single contrast (non-ionic iodine based). The study is usually tailored to your symptoms so a good history is essential. Attention is paid during the study to the form, structure and configuration of the esophagus, looking for functional disorders (such as aspiration, dysphagia, achalasia, motility and reflux) EXAMINATION You may be asked to change into a gown, depending on the type of swallow being performed. A radiologist and radiographer will perform the procedure. The radiologist will advise you of the type of contrast selected for your procedure and direct you during the exam. You will be asked to stand, sit or lie in several different positions and to hold a small amount of fluid in your mouth before being asked to swallow while the imaging is performed .In some instances you may be asked to swallow barium coated marshmallows to assess the motility of a solid food bolus. The exam can be recorded as a digital or video fluoroscopy procedure. POST PROCEDURE It will take 1-2 days for the barium to pass through your system. To facilitate this, it is important, unless otherwise directed, to increase your fluids for the next 24-48hrs and to resume your normal diet.  This test typically takes about 30 minutes to  perform. __________________________________________________________________________________

## 2018-02-16 ENCOUNTER — Ambulatory Visit (HOSPITAL_COMMUNITY): Payer: Managed Care, Other (non HMO)

## 2018-02-18 ENCOUNTER — Ambulatory Visit
Admission: RE | Admit: 2018-02-18 | Discharge: 2018-02-18 | Disposition: A | Discharge status: Home / Self Care | Payer: Managed Care, Other (non HMO) | Source: Ambulatory Visit | Attending: Family Medicine | Admitting: Family Medicine

## 2018-02-18 DIAGNOSIS — R221 Localized swelling, mass and lump, neck: Secondary | ICD-10-CM

## 2018-02-18 DIAGNOSIS — E039 Hypothyroidism, unspecified: Secondary | ICD-10-CM

## 2018-02-25 ENCOUNTER — Encounter: Payer: Self-pay | Admitting: Endocrinology

## 2018-04-14 NOTE — Progress Notes (Signed)
Patient ID: Kevin Chung, male   DOB: 11/29/1960, 57 y.o.   MRN: [id number]                                                                                                                     Reason for Appointment:  Hyperthyroidism, new consultation  Referring healthcare provider: Maurice Small   Chief complaint: Tiredness   History of Present Illness:   For the last 6 months patient has had symptoms of weakness, feeling cold, fatigue. He was seen by his PCP in July and although details are not available he was complaining of discomfort in his throat area and some difficulty swallowing He does not think that he had any weight loss, palpitations, shakiness or heat intolerance at that time  He was also found to have a mass on his right thyroid and was sent for an ultrasound which did not show any abnormality  Apparently TSH level was undetectable and 01/17/2018 and again on 02/25/2018 No other thyroid labs are available for review today  He says that he is feeling somewhat better now and does not have any difficulty swallowing or throat discomfort Also has a GI consultation in July for difficulty swallowing  He is still feeling somewhat tired and weak at times but not consistently He tends to be sensitive to cold  Wt Readings from Last 3 Encounters:  04/15/18 161 lb (73 kg)  02/10/18 155 lb 4 oz (70.4 kg)  08/27/14 162 lb (73.5 kg)     Thyroid function tests as follows:     No results found for: FREET4, T3FREE, TSH  No results found for: THYROTRECAB   Allergies as of 04/15/2018   No Known Allergies     Medication List        Accurate as of 04/15/18  2:34 PM. Always use your most recent med list.          amLODipine 5 MG tablet Commonly known as:  NORVASC Take 1 tablet by mouth daily.   losartan 50 MG tablet Commonly known as:  COZAAR Take 1 tablet by mouth daily.           Past Medical History:  Diagnosis Date  . Anemia   . GERD (gastroesophageal reflux  disease)   . Hypertension   . Palpitations 2008   "neg" work up per patient  . Prostate cancer ([place])   . Sleep apnea    STOP BANG SCORE 4    Past Surgical History:  Procedure Laterality Date  . ESOPHAGOGASTRODUODENOSCOPY N/A 08/14/2014   Procedure: ESOPHAGOGASTRODUODENOSCOPY (EGD);  Surgeon: Lear Ng, MD;  Location: [facility] ENDOSCOPY;  Service: Endoscopy;  Laterality: N/A;  . ROBOT ASSISTED LAPAROSCOPIC RADICAL PROSTATECTOMY  04/13/2012   Procedure: ROBOTIC ASSISTED LAPAROSCOPIC RADICAL PROSTATECTOMY;  Surgeon: Bernestine Amass, MD;  Location: [facility] ORS;  Service: Urology;  Laterality: N/A;  BILATERAL PELVIC LYMPH NODE DISSECTION     Family History  Problem Relation Age of Onset  . Prostate cancer Father   .  Hypertension Maternal Grandmother   . Diabetes Paternal Grandmother     Social History:  reports that he has been smoking cigarettes. He has a 25.00 pack-year smoking history. He has never used smokeless tobacco. He reports that he drinks alcohol. He reports that he does not use drugs.  Allergies: No Known Allergies   Review of Systems  Constitutional: Negative for weight loss and reduced appetite.  HENT:       Complains of dryness of his throat and a bitter taste  Eyes: Negative for blurred vision.  Respiratory: Negative for shortness of breath.   Cardiovascular: Negative for palpitations.  Gastrointestinal: Positive for constipation.  Endocrine: Positive for fatigue and cold intolerance.  Musculoskeletal: Negative for joint pain and muscle aches.  Neurological: Positive for weakness. Negative for tremors.       Mild generalized weakness and improving now       Examination:   BP (!) 142/96 (BP Location: Left Arm, Patient Position: Sitting, Cuff Size: Normal)   Pulse 86   Ht 5' 11.5" (1.816 m)   Wt 161 lb (73 kg)   SpO2 97%   BMI 22.14 kg/m    General Appearance:  well-built and nourished, pleasant, not anxious or hyperkinetic.         Eyes: No prominence,  lid lag or stare present.  No swelling    Neck: The thyroid is palpable on the right lateral lobe, about 1-1/2-2 times normal, nontender Left lobe not palpable  There is no lymphadenopathy in the neck .           Heart: normal S1 and S2, no murmurs .          Lungs: breath sounds are normal bilaterally without added sounds  Abdomen: no hepatosplenomegaly or other palpable abnormality  Extremities: hands are warm. No ankle edema.  Neurological:  No fine tremors are present. Deep tendon reflexes at biceps are normal.  Skin: No rash, abnormal thickening of the skin on the lower legs seen     Assessment/Plan:   Hyperthyroidism with suppressed TSH in July and August  He did not have any typical symptoms of hyperthyroidism and mostly was complaining of nonspecific fatigue along with leg discomfort and some trouble swallowing He has had a thyroid enlargement without a nodule as seen on the ultrasound  Currently he is subjectively feeling better despite not being on any treatment for the last 10 to 12 weeks of his history  On exam he looks euthyroid Has only enlargement of the right lateral thyroid lobe without tenderness  Most likely patient has had an episode of subacute thyroiditis with symptoms related to thyroid inflammation and resulting in mild transient hyperthyroidism He is only having mild intermittent fatigue now as his only symptoms  Discussed that we will need to check his thyroid labs today to decide on further management if any His symptoms of dry mouth and bitter taste are likely unrelated  Consult note sent to referring physician  Elayne Snare 04/15/2018, 2:34 PM    Note: This office note was prepared with Dragon voice recognition system technology. Any transcriptional errors that result from this process are unintentional.   ADDENDUM: Labs as follows: He has slight increase in TSH which confirms that he has recovered from subacute or silent thyroiditis No  further treatment or evaluation needed at this time and will do a final follow-up in 2 months  Lab Results  Component Value Date   TSH 4.69 (H) 04/15/2018   FREET4 0.71 04/15/2018  Elayne Snare

## 2018-04-15 ENCOUNTER — Ambulatory Visit: Payer: Managed Care, Other (non HMO) | Admitting: Endocrinology

## 2018-04-15 ENCOUNTER — Encounter: Payer: Self-pay | Admitting: Endocrinology

## 2018-04-15 VITALS — BP 142/96 | HR 86 | Ht 71.5 in | Wt 161.0 lb

## 2018-04-15 DIAGNOSIS — E061 Subacute thyroiditis: Secondary | ICD-10-CM

## 2018-04-15 DIAGNOSIS — R7989 Other specified abnormal findings of blood chemistry: Secondary | ICD-10-CM | POA: Diagnosis not present

## 2018-04-15 LAB — TSH: TSH: 4.69 u[IU]/mL — ABNORMAL HIGH (ref 0.35–4.50)

## 2018-04-15 LAB — T4, FREE: Free T4: 0.71 ng/dL (ref 0.60–1.60)

## 2018-04-15 LAB — T3, FREE: T3, Free: 3.5 pg/mL (ref 2.3–4.2)

## 2018-04-18 NOTE — Progress Notes (Signed)
LVM--to call the office back regarding lab results. 

## 2018-06-13 ENCOUNTER — Other Ambulatory Visit (INDEPENDENT_AMBULATORY_CARE_PROVIDER_SITE_OTHER): Payer: Managed Care, Other (non HMO)

## 2018-06-13 DIAGNOSIS — E061 Subacute thyroiditis: Secondary | ICD-10-CM

## 2018-06-13 LAB — T4, FREE: Free T4: 0.81 ng/dL (ref 0.60–1.60)

## 2018-06-13 LAB — TSH: TSH: 3.99 u[IU]/mL (ref 0.35–4.50)

## 2018-06-14 ENCOUNTER — Other Ambulatory Visit: Payer: Managed Care, Other (non HMO)

## 2018-06-22 ENCOUNTER — Ambulatory Visit: Payer: Managed Care, Other (non HMO) | Admitting: Endocrinology

## 2018-06-27 ENCOUNTER — Ambulatory Visit: Payer: Managed Care, Other (non HMO) | Admitting: Endocrinology

## 2018-06-27 ENCOUNTER — Encounter: Payer: Self-pay | Admitting: Endocrinology

## 2018-06-27 VITALS — BP 138/74 | HR 84 | Ht 71.5 in | Wt 169.8 lb

## 2018-06-27 DIAGNOSIS — E061 Subacute thyroiditis: Secondary | ICD-10-CM | POA: Diagnosis not present

## 2018-06-27 DIAGNOSIS — Z23 Encounter for immunization: Secondary | ICD-10-CM

## 2018-06-27 NOTE — Progress Notes (Signed)
Patient ID: Kevin Chung, male   DOB: [birth year], 59 y.o.   MRN: [id number]                                                                                                                     Reason for Appointment: Follow-up of thyroid  Referring healthcare provider: Maurice Small   Chief complaint: None   History of Present Illness:    Prior to his initial consultation he had symptoms of weakness, feeling cold, fatigue. He was seen by his PCP in July and he was also complaining of discomfort in his throat area and some difficulty swallowing He does not think he has lost weight at that time He was also found to have a mass on his right thyroid and was sent for an ultrasound which did not show any abnormality  Apparently TSH level was undetectable in 01/17/2018 and again on 02/25/2018  RECENT history:  On his initial consultation in 9/19 TSH was mildly increased He also had a small thyroid enlargement on the right which was nontender  Because of his diagnosis of subacute thyroiditis no treatment was started He is not coming back for follow-up about 2 months later  He feels fairly good with no cold intolerance or fatigue He is not sure why he has gained weight His only symptom is some dryness of the throat  His thyroid levels are now more normal  Wt Readings from Last 3 Encounters:  06/27/18 169 lb 12.8 oz (77 kg)  04/15/18 161 lb (73 kg)  02/10/18 155 lb 4 oz (70.4 kg)     Thyroid function tests as follows:     Lab Results  Component Value Date   FREET4 0.81 06/13/2018   FREET4 0.71 04/15/2018   T3FREE 3.5 04/15/2018   TSH 3.99 06/13/2018   TSH 4.69 (H) 04/15/2018    No results found for: THYROTRECAB   Allergies as of 06/27/2018   No Known Allergies     Medication List        Accurate as of 06/27/18  9:09 AM. Always use your most recent med list.          amLODipine 5 MG tablet Commonly known as:  NORVASC Take 1 tablet by mouth daily.   losartan 50 MG  tablet Commonly known as:  COZAAR Take 1 tablet by mouth daily.           Past Medical History:  Diagnosis Date  . Anemia   . GERD (gastroesophageal reflux disease)   . Hypertension   . Palpitations 2008   "neg" work up per patient  . Prostate cancer ([place])   . Sleep apnea    STOP BANG SCORE 4    Past Surgical History:  Procedure Laterality Date  . ESOPHAGOGASTRODUODENOSCOPY N/A 08/14/2014   Procedure: ESOPHAGOGASTRODUODENOSCOPY (EGD);  Surgeon: Lear Ng, MD;  Location: [facility] ENDOSCOPY;  Service: Endoscopy;  Laterality: N/A;  . ROBOT ASSISTED LAPAROSCOPIC RADICAL PROSTATECTOMY  04/13/2012   Procedure: ROBOTIC ASSISTED LAPAROSCOPIC RADICAL  PROSTATECTOMY;  Surgeon: Bernestine Amass, MD;  Location: [facility] ORS;  Service: Urology;  Laterality: N/A;  BILATERAL PELVIC LYMPH NODE DISSECTION     Family History  Problem Relation Age of Onset  . Prostate cancer Father   . Hypertension Maternal Grandmother   . Diabetes Paternal Grandmother   . Thyroid disease Neg Hx     Social History:  reports that he has been smoking cigarettes. He has a 25.00 pack-year smoking history. He has never used smokeless tobacco. He reports that he drinks alcohol. He reports that he does not use drugs.  Allergies: No Known Allergies   Review of Systems     Examination:   BP 138/74 (BP Location: Left Arm, Patient Position: Sitting, Cuff Size: Normal)   Pulse 84   Ht 5' 11.5" (1.816 m)   Wt 169 lb 12.8 oz (77 kg)   SpO2 97%   BMI 23.35 kg/m   Thyroid is not palpable  There is no lymphadenopathy in the neck .           Biceps reflexes appear normal   Assessment/Plan:  Subacute thyroiditis, resolved  His thyroid enlargement is not present He is symptomatically doing well Not clear why he has gained weight but his thyroid levels are normal  Explained to him that he did not go through a cycle of subacute thyroiditis with initial discomfort in his thyroid area and a mild elevation of  thyroid levels which was transient  He likely will not have any further symptoms and can come back as needed To follow-up with PCP for other problems  Elayne Snare 06/27/2018, 9:09 AM    Note: This office note was prepared with Dragon voice recognition system technology. Any transcriptional errors that result from this process are unintentional.     Lab Results  Component Value Date   TSH 3.99 06/13/2018   TSH 4.69 (H) 04/15/2018   FREET4 0.81 06/13/2018   FREET4 0.71 04/15/2018     Elayne Snare

## 2019-05-14 IMAGING — US US THYROID
1 series · 14 of 25 positions shown · non-contrast
Comparison: None.

CLINICAL DATA: Hypothyroid. 56-year-old male with thyromegaly and
hypothyroidism.

EXAM:
THYROID ULTRASOUND
TECHNIQUE: Ultrasound examination of the thyroid gland and adjacent soft
tissues was performed.

[Series 1: us thyroid · 0.07mm/px · 14 of 43 slices shown]
[im 1/43]
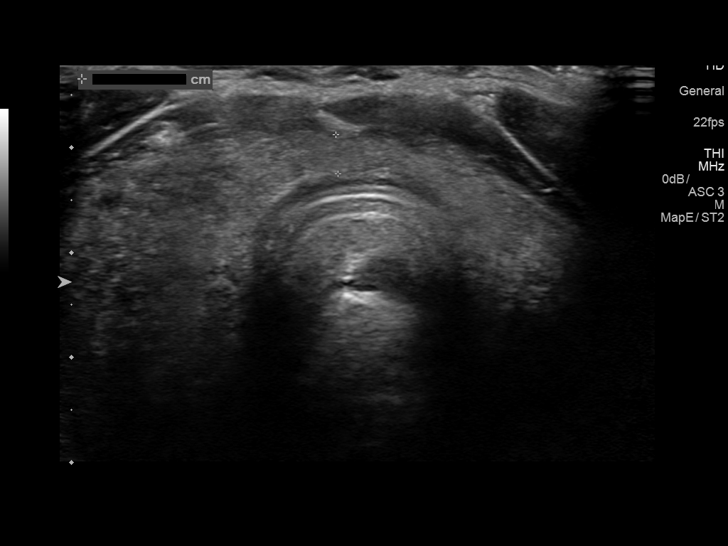
[im 4/43]
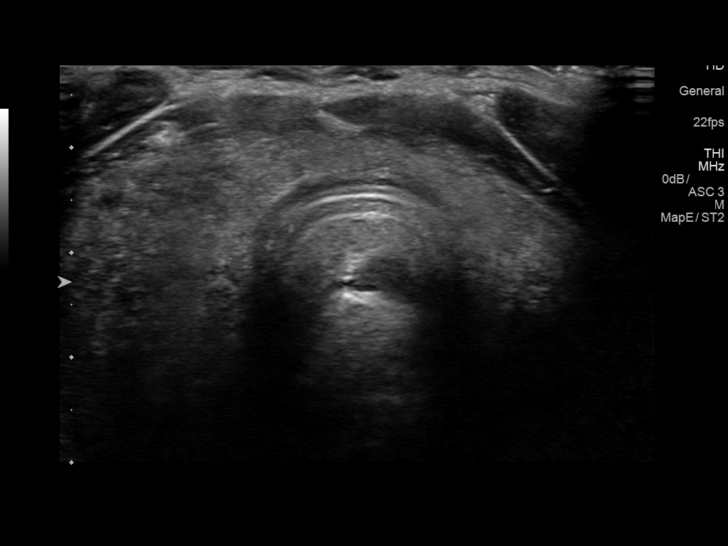
[im 8/43]
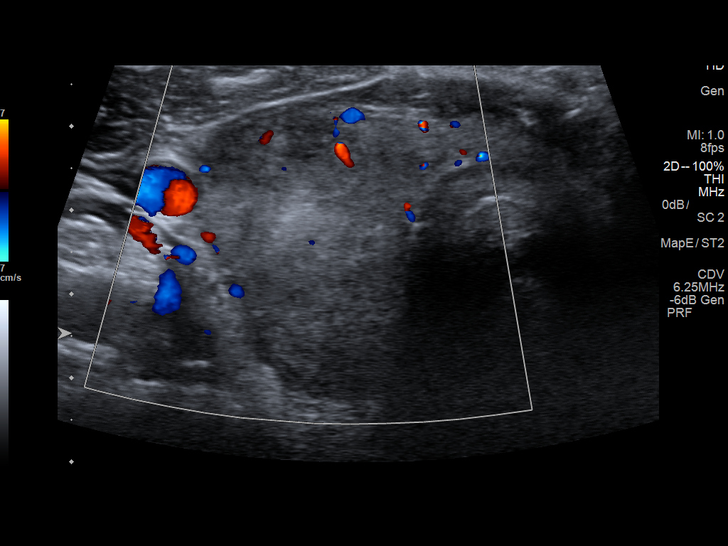
[im 11/43]
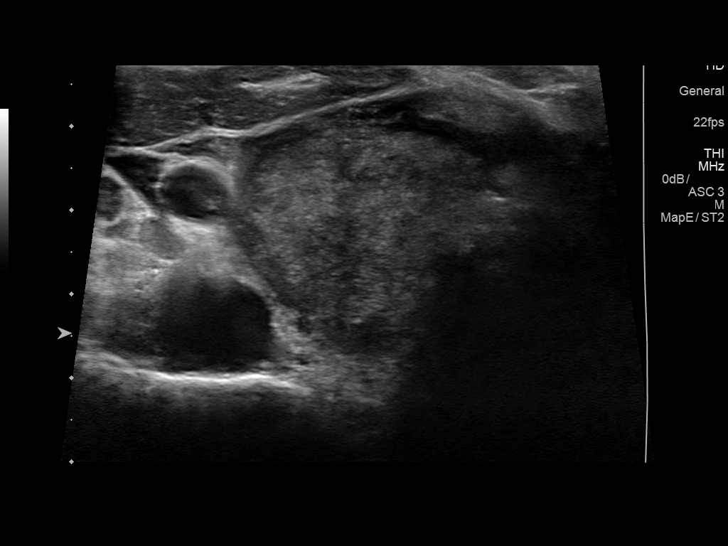
[im 15/43]
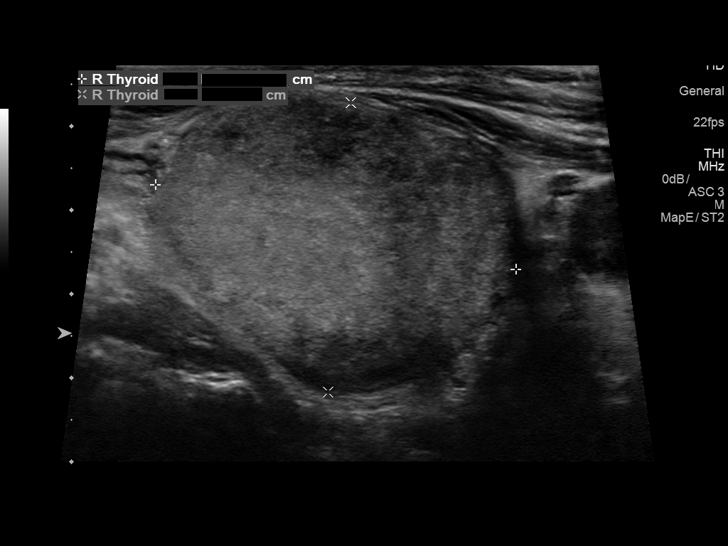
[im 16/43]
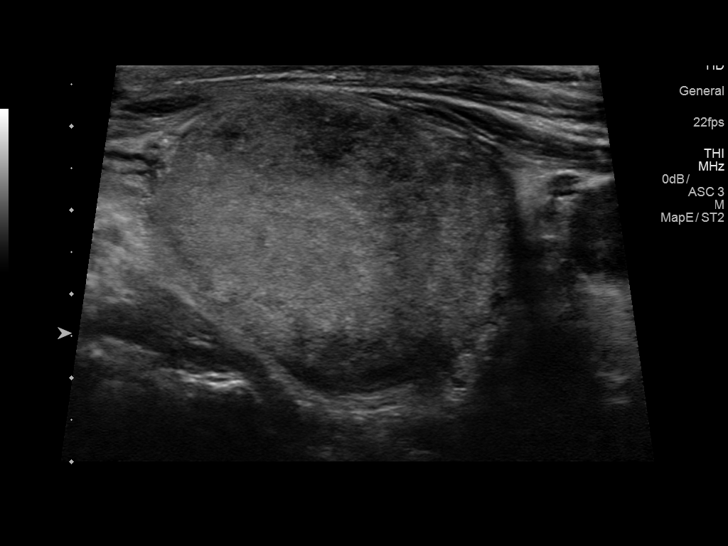
[im 20/43]
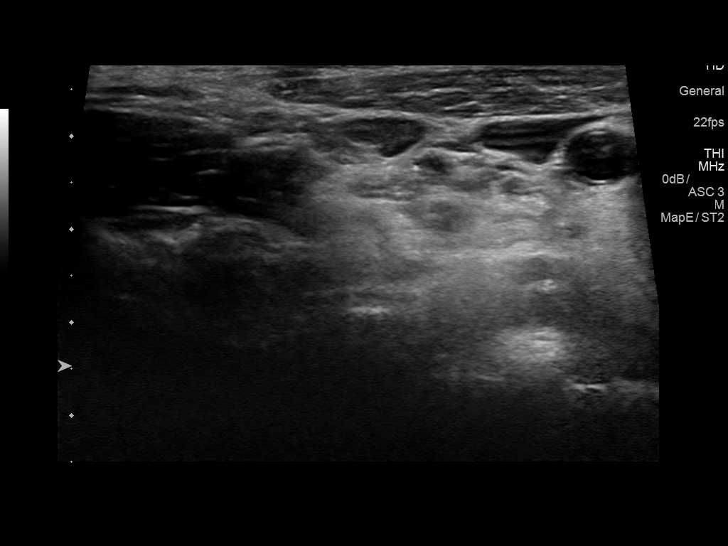
[im 23/43]
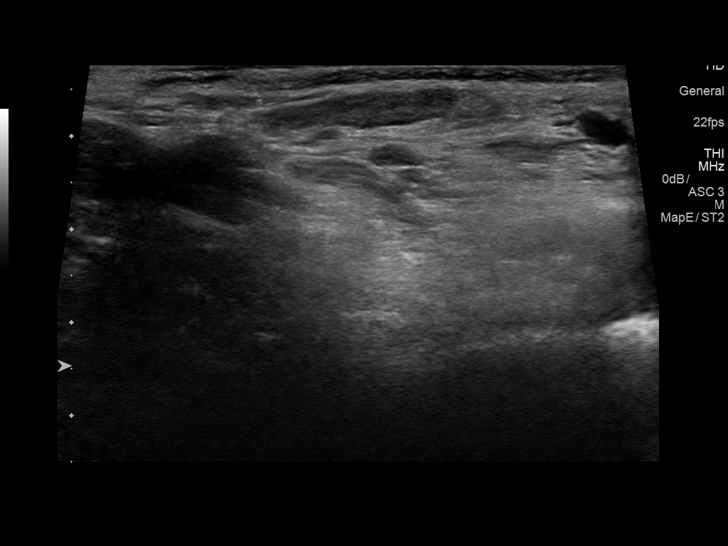
[im 27/43]
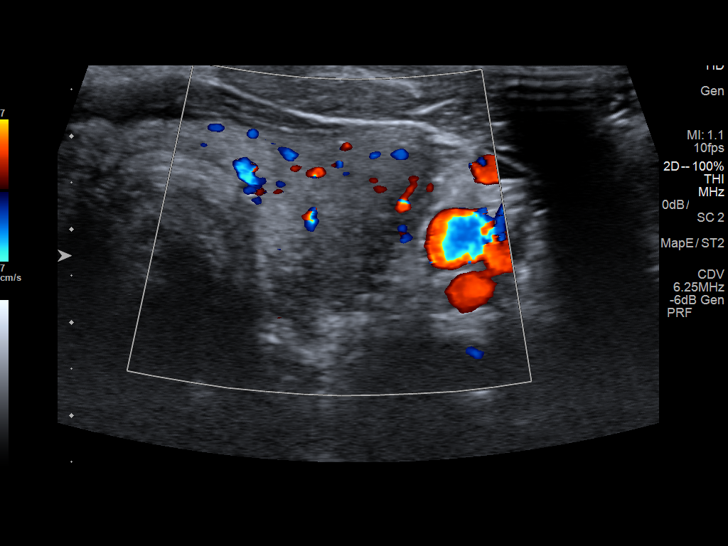
[im 29/43]
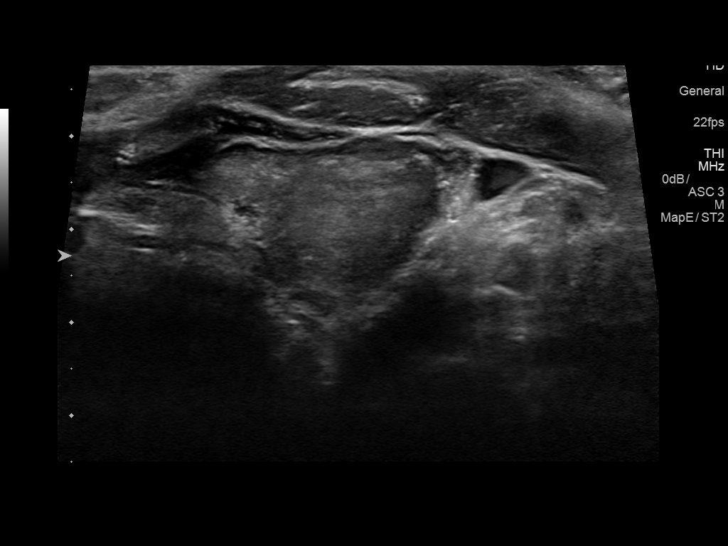
[im 32/43]
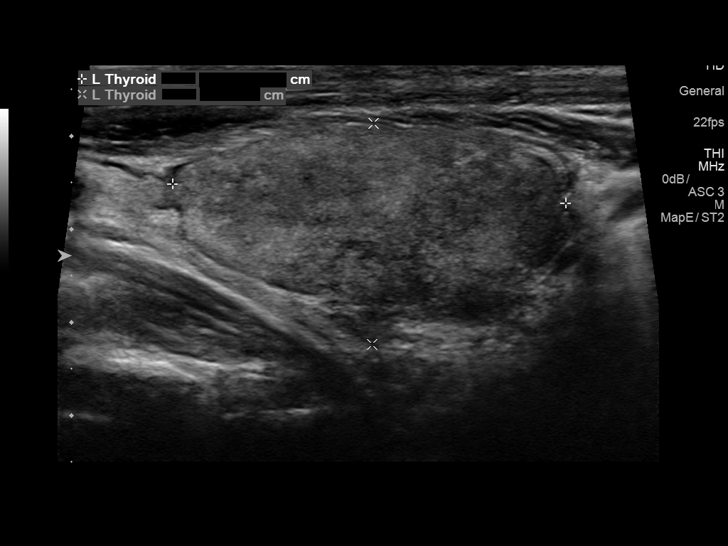
[im 36/43]
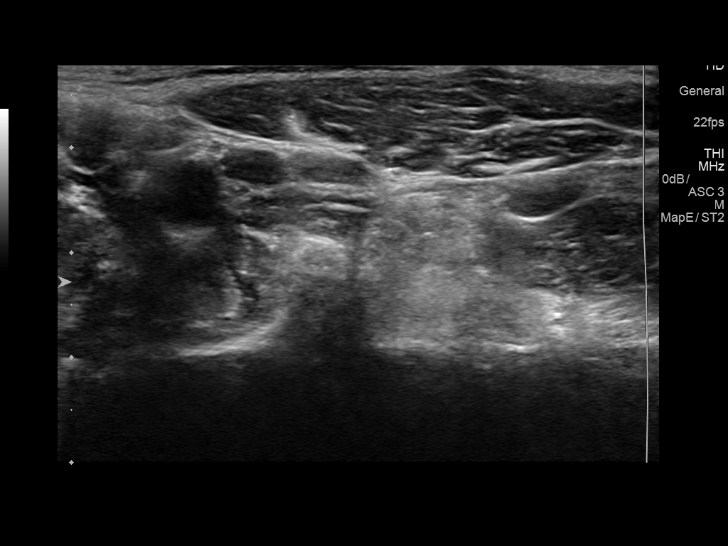
[im 39/43]
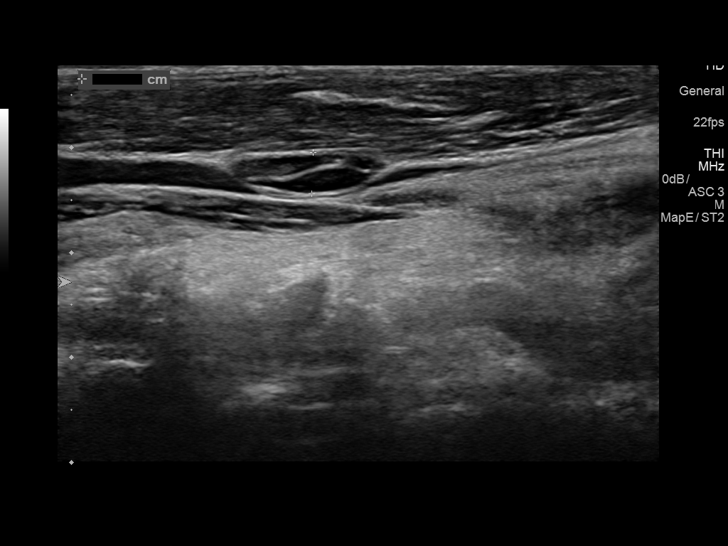
[im 43/43]
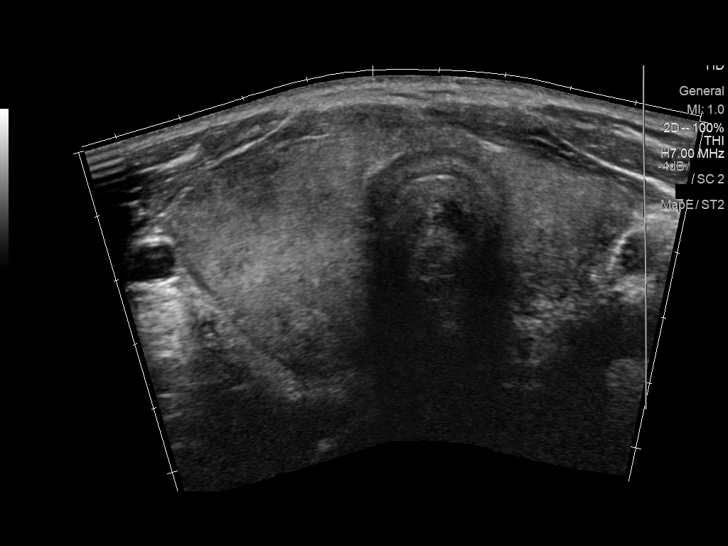

[14 of 25 positions shown; findings below may reference images not displayed]

FINDINGS: Parenchymal Echotexture: Moderately heterogenous

Isthmus: 0.4 cm

Right lobe: 4.4 x 3.5 x 2.7 cm

Left lobe: 4.2 x 2.4 x 2.1 cm

_________________________________________________________

Estimated total number of nodules >/= 1 cm: 0

Number of spongiform nodules >/=  2 cm not described below (TR1): 0

Number of mixed cystic and solid nodules >/= 1.5 cm not described
below (TR2): 0

_________________________________________________________

No discrete nodules are seen within the thyroid gland.
IMPRESSION: Diffusely heterogeneous and enlarged thyroid gland. No discrete
thyroid nodules visualized.

The above is in keeping with the ACR TI-RADS recommendations - [HOSPITAL] 5893;[DATE].

## 2021-04-09 ENCOUNTER — Ambulatory Visit: Payer: Managed Care, Other (non HMO) | Admitting: Internal Medicine

## 2022-02-12 ENCOUNTER — Other Ambulatory Visit: Payer: Self-pay | Admitting: *Deleted

## 2022-02-12 DIAGNOSIS — Z87891 Personal history of nicotine dependence: Secondary | ICD-10-CM

## 2022-02-12 DIAGNOSIS — Z122 Encounter for screening for malignant neoplasm of respiratory organs: Secondary | ICD-10-CM

## 2022-03-04 ENCOUNTER — Encounter: Payer: Self-pay | Admitting: Acute Care

## 2022-03-04 ENCOUNTER — Ambulatory Visit (INDEPENDENT_AMBULATORY_CARE_PROVIDER_SITE_OTHER): Payer: Managed Care, Other (non HMO) | Admitting: Acute Care

## 2022-03-04 DIAGNOSIS — F17210 Nicotine dependence, cigarettes, uncomplicated: Secondary | ICD-10-CM

## 2022-03-04 NOTE — Progress Notes (Signed)
Virtual Visit via Telephone Note  I connected with Kevin Chung on 03/04/22 at  9:00 AM EDT by telephone and verified that I am speaking with the correct person using two identifiers.  Location: Patient: At home Provider: [facility], [place], [place], [address]    I discussed the limitations, risks, security and privacy concerns of performing an evaluation and management service by telephone and the availability of in person appointments. I also discussed with the patient that there may be a patient responsible charge related to this service. The patient expressed understanding and agreed to proceed.    Shared Decision Making Visit Lung Cancer Screening Program [phone])   Eligibility: Age 61 y.o. Pack Years Smoking History Calculation 25 pack year smoking history (# packs/per year x # years smoked) Recent History of coughing up blood  no Unexplained weight loss? no ( >Than 15 pounds within the last 6 months ) Prior History Lung / other cancer no (Diagnosis within the last 5 years already requiring surveillance chest CT Scans). Smoking Status Current Smoker Former Smokers: Years since quit: NA  Quit Date:  NA  Visit Components: Discussion included one or more decision making aids. yes Discussion included risk/benefits of screening. yes Discussion included potential follow up diagnostic testing for abnormal scans. yes Discussion included meaning and risk of over diagnosis. yes Discussion included meaning and risk of False Positives. yes Discussion included meaning of total radiation exposure. yes  Counseling Included: Importance of adherence to annual lung cancer LDCT screening. yes Impact of comorbidities on ability to participate in the program. yes Ability and willingness to under diagnostic treatment. yes  Smoking Cessation Counseling: Current Smokers:  Discussed importance of smoking cessation. yes Information about tobacco cessation classes and interventions  provided to patient. yes Patient provided with "ticket" for LDCT Scan. yes Symptomatic Patient. no  Counseling NA Diagnosis Code: Tobacco Use Z72.0 Asymptomatic Patient yes  Counseling (Intermediate counseling: > three minutes counseling) B3532 Former Smokers:  Discussed the importance of maintaining cigarette abstinence. yes Diagnosis Code: Personal History of Nicotine Dependence. D92.426 Information about tobacco cessation classes and interventions provided to patient. Yes Patient provided with "ticket" for LDCT Scan. yes Written Order for Lung Cancer Screening with LDCT placed in Epic. Yes (CT Chest Lung Cancer Screening Low Dose W/O CM) STM1962 Z12.2-Screening of respiratory organs Z87.891-Personal history of nicotine dependence  I have spent 25 minutes of face to face/ virtual visit   time with  Kevin Chung discussing the risks and benefits of lung cancer screening. We viewed / discussed a power point together that explained in detail the above noted topics. We paused at intervals to allow for questions to be asked and answered to ensure understanding.We discussed that the single most powerful action that he can take to decrease his risk of developing lung cancer is to quit smoking. We discussed whether or not he is ready to commit to setting a quit date. We discussed options for tools to aid in quitting smoking including nicotine replacement therapy, non-nicotine medications, support groups, Quit Smart classes, and behavior modification. We discussed that often times setting smaller, more achievable goals, such as eliminating 1 cigarette a day for a week and then 2 cigarettes a day for a week can be helpful in slowly decreasing the number of cigarettes smoked. This allows for a sense of accomplishment as well as providing a clinical benefit. I provided  him  with smoking cessation  information  with contact information for community resources, classes, free nicotine  replacement therapy, and  access to mobile apps, text messaging, and on-line smoking cessation help. I have also provided  him  the office contact information in the event he needs to contact me, or the screening staff. We discussed the time and location of the scan, and that either Doroteo Glassman RN, Joella Prince, RN  or I will call / send a letter with the results within 24-72 hours of receiving them. The patient verbalized understanding of all of  the above and had no further questions upon leaving the office. They have my contact information in the event they have any further questions.  I spent 3 minutes counseling on smoking cessation and the health risks of continued tobacco abuse.  I explained to the patient that there has been a high incidence of coronary artery disease noted on these exams. I explained that this is a non-gated exam therefore degree or severity cannot be determined. This patient is noton statin therapy. I have asked the patient to follow-up with their PCP regarding any incidental finding of coronary artery disease and management with diet or medication as their PCP  feels is clinically indicated. The patient verbalized understanding of the above and had no further questions upon completion of the visit.      Magdalen Spatz, NP 03/04/2022

## 2022-03-04 NOTE — Patient Instructions (Signed)
Thank you for participating in the [place] Lung Cancer Screening Program. It was our pleasure to meet you today. We will call you with the results of your scan within the next few days. Your scan will be assigned a Lung RADS category score by the physicians reading the scans.  This Lung RADS score determines follow up scanning.  See below for description of categories, and follow up screening recommendations. We will be in touch to schedule your follow up screening annually or based on recommendations of our providers. We will fax a copy of your scan results to your Primary Care Physician, or the physician who referred you to the program, to ensure they have the results. Please call the office if you have any questions or concerns regarding your scanning experience or results.  Our office number is [phone]. Please speak with Denise Phelps, RN. , or  Denise Buckner RN, They are  our Lung Cancer Screening RN.'s If They are unavailable when you call, Please leave a message on the voice mail. We will return your call at our earliest convenience.This voice mail is monitored several times a day.  Remember, if your scan is normal, we will scan you annually as long as you continue to meet the criteria for the program. (Age 55-77, Current smoker or smoker who has quit within the last 15 years). If you are a smoker, remember, quitting is the single most powerful action that you can take to decrease your risk of lung cancer and other pulmonary, breathing related problems. We know quitting is hard, and we are here to help.  Please let us know if there is anything we can do to help you meet your goal of quitting. If you are a former smoker, congratulations. We are proud of you! Remain smoke free! Remember you can refer friends or family members through the number above.  We will screen them to make sure they meet criteria for the program. Thank you for helping us take better care of you by  participating in Lung Screening.  You can receive free nicotine replacement therapy ( patches, gum or mints) by calling 1-800-QUIT NOW. Please call so we can get you on the path to becoming  a non-smoker. I know it is hard, but you can do this!  Lung RADS Categories:  Lung RADS 1: no nodules or definitely non-concerning nodules.  Recommendation is for a repeat annual scan in 12 months.  Lung RADS 2:  nodules that are non-concerning in appearance and behavior with a very low likelihood of becoming an active cancer. Recommendation is for a repeat annual scan in 12 months.  Lung RADS 3: nodules that are probably non-concerning , includes nodules with a low likelihood of becoming an active cancer.  Recommendation is for a 6-month repeat screening scan. Often noted after an upper respiratory illness. We will be in touch to make sure you have no questions, and to schedule your 6-month scan.  Lung RADS 4 A: nodules with concerning findings, recommendation is most often for a follow up scan in 3 months or additional testing based on our provider's assessment of the scan. We will be in touch to make sure you have no questions and to schedule the recommended 3 month follow up scan.  Lung RADS 4 B:  indicates findings that are concerning. We will be in touch with you to schedule additional diagnostic testing based on our provider's  assessment of the scan.  Other options for assistance in smoking cessation (   As covered by your insurance benefits)  Hypnosis for smoking cessation  [employer] [phone]  Acupuncture for smoking cessation  [facility] [phone]   

## 2022-03-05 ENCOUNTER — Ambulatory Visit
Admission: RE | Admit: 2022-03-05 | Discharge: 2022-03-05 | Disposition: A | Discharge status: Home / Self Care | Payer: Managed Care, Other (non HMO) | Source: Ambulatory Visit | Attending: Acute Care | Admitting: Acute Care

## 2022-03-05 ENCOUNTER — Ambulatory Visit (HOSPITAL_BASED_OUTPATIENT_CLINIC_OR_DEPARTMENT_OTHER): Payer: Managed Care, Other (non HMO)

## 2022-03-05 DIAGNOSIS — Z87891 Personal history of nicotine dependence: Secondary | ICD-10-CM

## 2022-03-05 DIAGNOSIS — Z122 Encounter for screening for malignant neoplasm of respiratory organs: Secondary | ICD-10-CM

## 2022-03-09 ENCOUNTER — Other Ambulatory Visit: Payer: Self-pay

## 2022-03-09 DIAGNOSIS — Z122 Encounter for screening for malignant neoplasm of respiratory organs: Secondary | ICD-10-CM

## 2022-03-09 DIAGNOSIS — Z87891 Personal history of nicotine dependence: Secondary | ICD-10-CM

## 2022-03-09 DIAGNOSIS — F17210 Nicotine dependence, cigarettes, uncomplicated: Secondary | ICD-10-CM

## 2023-03-01 ENCOUNTER — Encounter: Payer: Self-pay | Admitting: Acute Care

## 2023-03-09 ENCOUNTER — Ambulatory Visit
Admission: RE | Admit: 2023-03-09 | Discharge: 2023-03-09 | Disposition: A | Discharge status: Home / Self Care | Payer: Managed Care, Other (non HMO) | Source: Ambulatory Visit | Attending: Acute Care | Admitting: Acute Care

## 2023-03-09 DIAGNOSIS — Z87891 Personal history of nicotine dependence: Secondary | ICD-10-CM

## 2023-03-09 DIAGNOSIS — Z122 Encounter for screening for malignant neoplasm of respiratory organs: Secondary | ICD-10-CM

## 2023-03-09 DIAGNOSIS — F17210 Nicotine dependence, cigarettes, uncomplicated: Secondary | ICD-10-CM

## 2023-03-17 ENCOUNTER — Other Ambulatory Visit: Payer: Self-pay | Admitting: Acute Care

## 2023-03-17 DIAGNOSIS — F17210 Nicotine dependence, cigarettes, uncomplicated: Secondary | ICD-10-CM

## 2023-03-17 DIAGNOSIS — Z87891 Personal history of nicotine dependence: Secondary | ICD-10-CM

## 2023-03-17 DIAGNOSIS — Z122 Encounter for screening for malignant neoplasm of respiratory organs: Secondary | ICD-10-CM
